# Patient Record
Sex: Male | Born: 1950
Health system: Southern US, Community
[De-identification: ages and names within clinical notes are randomized; demographics above are authoritative.]

## PROBLEM LIST (undated history)

## (undated) DIAGNOSIS — G43909 Migraine, unspecified, not intractable, without status migrainosus: Secondary | ICD-10-CM

## (undated) DIAGNOSIS — T7840XA Allergy, unspecified, initial encounter: Secondary | ICD-10-CM

## (undated) DIAGNOSIS — B019 Varicella without complication: Secondary | ICD-10-CM

## (undated) DIAGNOSIS — F1910 Other psychoactive substance abuse, uncomplicated: Secondary | ICD-10-CM

## (undated) DIAGNOSIS — M1990 Unspecified osteoarthritis, unspecified site: Secondary | ICD-10-CM

## (undated) HISTORY — DX: Unspecified osteoarthritis, unspecified site: M19.90

## (undated) HISTORY — DX: Migraine, unspecified, not intractable, without status migrainosus: G43.909

## (undated) HISTORY — DX: Varicella without complication: B01.9

## (undated) HISTORY — DX: Allergy, unspecified, initial encounter: T78.40XA

## (undated) HISTORY — DX: Other psychoactive substance abuse, uncomplicated: F19.10

---
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
## 2004-02-09 DIAGNOSIS — F1910 Other psychoactive substance abuse, uncomplicated: Secondary | ICD-10-CM

## 2004-02-09 HISTORY — DX: Other psychoactive substance abuse, uncomplicated: F19.10

## 2004-12-08 ENCOUNTER — Encounter: Admission: RE | Admit: 2004-12-08 | Discharge: 2004-12-08 | Payer: Self-pay | Admitting: Neurosurgery

## 2004-12-24 ENCOUNTER — Encounter: Admission: RE | Admit: 2004-12-24 | Discharge: 2004-12-24 | Payer: Self-pay | Admitting: Neurosurgery

## 2005-02-03 ENCOUNTER — Encounter: Admission: RE | Admit: 2005-02-03 | Discharge: 2005-02-03 | Payer: Self-pay | Admitting: Neurosurgery

## 2005-05-19 ENCOUNTER — Encounter: Payer: Self-pay | Admitting: Neurosurgery

## 2006-10-21 ENCOUNTER — Ambulatory Visit: Payer: Self-pay | Admitting: General Surgery

## 2009-02-08 HISTORY — PX: HERNIA REPAIR: SHX51

## 2010-02-08 HISTORY — PX: CATARACT EXTRACTION, BILATERAL: SHX1313

## 2010-02-08 HISTORY — PX: HERNIA REPAIR: SHX51

## 2010-02-20 ENCOUNTER — Ambulatory Visit: Payer: Self-pay | Admitting: Surgery

## 2010-02-27 ENCOUNTER — Ambulatory Visit: Payer: Self-pay | Admitting: Surgery

## 2010-02-28 ENCOUNTER — Encounter: Payer: Self-pay | Admitting: Neurosurgery

## 2010-03-02 LAB — PATHOLOGY REPORT

## 2010-04-01 ENCOUNTER — Ambulatory Visit: Payer: Self-pay | Admitting: Ophthalmology

## 2011-01-05 ENCOUNTER — Emergency Department: Payer: Self-pay | Admitting: Emergency Medicine

## 2011-01-13 ENCOUNTER — Ambulatory Visit: Payer: Self-pay | Admitting: Specialist

## 2011-02-09 HISTORY — PX: HIP SURGERY: SHX245

## 2011-03-15 ENCOUNTER — Ambulatory Visit: Payer: Self-pay | Admitting: General Practice

## 2011-09-15 ENCOUNTER — Ambulatory Visit: Payer: Self-pay | Admitting: Ophthalmology

## 2012-08-30 ENCOUNTER — Encounter: Payer: Self-pay | Admitting: Internal Medicine

## 2012-08-30 ENCOUNTER — Ambulatory Visit (INDEPENDENT_AMBULATORY_CARE_PROVIDER_SITE_OTHER): Payer: No Typology Code available for payment source | Admitting: Internal Medicine

## 2012-08-30 VITALS — BP 126/76 | HR 70 | Temp 98.1°F | Resp 14 | Ht 69.0 in | Wt 171.2 lb

## 2012-08-30 DIAGNOSIS — Z125 Encounter for screening for malignant neoplasm of prostate: Secondary | ICD-10-CM

## 2012-08-30 DIAGNOSIS — Z13220 Encounter for screening for lipoid disorders: Secondary | ICD-10-CM

## 2012-08-30 DIAGNOSIS — Z23 Encounter for immunization: Secondary | ICD-10-CM

## 2012-08-30 DIAGNOSIS — M129 Arthropathy, unspecified: Secondary | ICD-10-CM

## 2012-08-30 DIAGNOSIS — M1990 Unspecified osteoarthritis, unspecified site: Secondary | ICD-10-CM

## 2012-08-30 DIAGNOSIS — R079 Chest pain, unspecified: Secondary | ICD-10-CM

## 2012-08-30 DIAGNOSIS — R5383 Other fatigue: Secondary | ICD-10-CM

## 2012-08-30 DIAGNOSIS — R5381 Other malaise: Secondary | ICD-10-CM

## 2012-08-30 MED ORDER — DEXLANSOPRAZOLE 60 MG PO CPDR: 60.0000 mg | DELAYED_RELEASE_CAPSULE | Freq: Every day | ORAL | Status: DC
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
## 2012-08-30 NOTE — Patient Instructions (Addendum)
Come back for fasting bloodwork at your earliest convenience  Referral to Dr Markham Jordan for both EGD and colonoscopy  Trial of Dexilant for your stomach.  Take it once daily in the morning.  After you run out ,  Switch to prevacid or  omemprazole , both are available OTC    You received the TDaP vaccine today (tetanus diphtheria and pertussis )

## 2012-09-01 ENCOUNTER — Encounter: Payer: Self-pay | Admitting: Internal Medicine

## 2012-09-01 DIAGNOSIS — M1990 Unspecified osteoarthritis, unspecified site: Secondary | ICD-10-CM | POA: Insufficient documentation

## 2012-09-01 MED ORDER — TRAMADOL HCL 50 MG PO TABS: 50.0000 mg | ORAL_TABLET | Freq: Three times a day (TID) | ORAL | Status: DC | PRN

## 2012-09-01 NOTE — Assessment & Plan Note (Signed)
His recent episode is atypical for ischemic chest pain but more indicative of an esophageal spasm precipitated  by a bolus of macaroni and cheese. I'm recommending that he stop nonsteroidals and have given samples of DEXA Lantus use for the next 2 weeks. After 2 weeks he can switch to an over-the-counter medication. Given his history recurrent reflux and bright red blood per rectum use of nonsteroidals he has been advised to follow through with the GI referral to Dr. Mechele Collin.

## 2012-09-01 NOTE — Assessment & Plan Note (Addendum)
He is tolerating twice daily use of naproxen without recurrent GI bleeding. However his recent episode of esophageal pain warrants further evaluation with endoscopy. I'm recommending him to stop using nonsteroidals currently in use tramadol. I am referring him to Dr. Mechele Collin for evaluation with endoscopy and colonoscopy.

## 2012-09-07 ENCOUNTER — Other Ambulatory Visit (INDEPENDENT_AMBULATORY_CARE_PROVIDER_SITE_OTHER): Payer: No Typology Code available for payment source

## 2012-09-07 DIAGNOSIS — Z13220 Encounter for screening for lipoid disorders: Secondary | ICD-10-CM

## 2012-09-07 DIAGNOSIS — R5383 Other fatigue: Secondary | ICD-10-CM

## 2012-09-07 DIAGNOSIS — Z125 Encounter for screening for malignant neoplasm of prostate: Secondary | ICD-10-CM

## 2012-09-07 DIAGNOSIS — R5381 Other malaise: Secondary | ICD-10-CM

## 2012-09-07 LAB — COMPREHENSIVE METABOLIC PANEL
ALT: 13 U/L (ref 0–53)
Albumin: 4 g/dL (ref 3.5–5.2)
Alkaline Phosphatase: 51 U/L (ref 39–117)
BUN: 18 mg/dL (ref 6–23)
CO2: 28 mEq/L (ref 19–32)
Calcium: 9.3 mg/dL (ref 8.4–10.5)
Chloride: 105 mEq/L (ref 96–112)
GFR: 120.53 mL/min (ref >60.00)
Glucose, Bld: 89 mg/dL (ref 70–99)
Potassium: 4.2 mEq/L (ref 3.5–5.1)
Sodium: 140 mEq/L (ref 135–145)
Total Bilirubin: 0.6 mg/dL (ref 0.3–1.2)
Total Protein: 6.5 g/dL (ref 6.0–8.3)

## 2012-09-07 LAB — CBC WITH DIFFERENTIAL/PLATELET
Basophils Absolute: 0 10*3/uL (ref 0.0–0.1)
Basophils Relative: 0.7 % (ref 0.0–3.0)
Eosinophils Absolute: 0.4 10*3/uL (ref 0.0–0.7)
Eosinophils Relative: 5.8 % — ABNORMAL HIGH (ref 0.0–5.0)
HCT: 41.6 % (ref 39.0–52.0)
Hemoglobin: 13.9 g/dL (ref 13.0–17.0)
Lymphocytes Relative: 22.6 % (ref 12.0–46.0)
Lymphs Abs: 1.5 10*3/uL (ref 0.7–4.0)
MCHC: 33.3 g/dL (ref 30.0–36.0)
MCV: 94.7 fl (ref 78.0–100.0)
Monocytes Absolute: 0.6 10*3/uL (ref 0.1–1.0)
Monocytes Relative: 9.6 % (ref 3.0–12.0)
Neutro Abs: 3.9 10*3/uL (ref 1.4–7.7)
Neutrophils Relative %: 61.3 % (ref 43.0–77.0)
Platelets: 248 10*3/uL (ref 150.0–400.0)
RBC: 4.39 Mil/uL (ref 4.22–5.81)
WBC: 6.4 10*3/uL (ref 4.5–10.5)

## 2012-09-07 LAB — LIPID PANEL
Cholesterol: 187 mg/dL (ref 0–200)
HDL: 51.2 mg/dL (ref >39.00)
LDL Cholesterol: 115 mg/dL — ABNORMAL HIGH (ref 0–99)
Total CHOL/HDL Ratio: 4
Triglycerides: 106 mg/dL (ref 0.0–149.0)
VLDL: 21.2 mg/dL (ref 0.0–40.0)

## 2012-09-07 LAB — TSH: TSH: 1.62 u[IU]/mL (ref 0.35–5.50)

## 2012-09-07 LAB — PSA: PSA: 1.35 ng/mL (ref 0.10–4.00)

## 2012-09-08 ENCOUNTER — Encounter: Payer: Self-pay | Admitting: *Deleted

## 2012-09-11 ENCOUNTER — Telehealth: Payer: Self-pay | Admitting: Internal Medicine

## 2012-09-11 DIAGNOSIS — R911 Solitary pulmonary nodule: Secondary | ICD-10-CM | POA: Insufficient documentation

## 2012-09-11 DIAGNOSIS — N402 Nodular prostate without lower urinary tract symptoms: Secondary | ICD-10-CM

## 2012-09-11 DIAGNOSIS — Z125 Encounter for screening for malignant neoplasm of prostate: Secondary | ICD-10-CM | POA: Insufficient documentation

## 2012-09-11 NOTE — Telephone Encounter (Signed)
The patient has decided against this xray and he stated that the lung nodule was nothing to worry about

## 2012-09-11 NOTE — Telephone Encounter (Signed)
I received his records from Dr Marguerite Olea.  He has a lung nodule that was found on chest x ray 2013 that ha snot had follow up per their notes...  I would like him to have one this week at [facility] (here in [place])

## 2012-09-15 ENCOUNTER — Encounter: Payer: Self-pay | Admitting: Internal Medicine

## 2012-10-30 ENCOUNTER — Ambulatory Visit: Payer: Self-pay | Admitting: Gastroenterology

## 2012-10-30 LAB — HM COLONOSCOPY: HM Colonoscopy: 2

## 2012-10-31 LAB — PATHOLOGY REPORT

## 2012-11-07 ENCOUNTER — Other Ambulatory Visit: Payer: Self-pay | Admitting: Internal Medicine

## 2012-11-07 DIAGNOSIS — Z860100 Personal history of colon polyps, unspecified: Secondary | ICD-10-CM | POA: Insufficient documentation

## 2012-11-07 DIAGNOSIS — K219 Gastro-esophageal reflux disease without esophagitis: Secondary | ICD-10-CM

## 2012-11-23 ENCOUNTER — Other Ambulatory Visit: Payer: Self-pay | Admitting: Internal Medicine

## 2015-01-01 ENCOUNTER — Encounter: Payer: No Typology Code available for payment source | Admitting: Internal Medicine

## 2015-03-13 ENCOUNTER — Encounter: Payer: Self-pay | Admitting: Internal Medicine

## 2015-03-13 ENCOUNTER — Encounter: Payer: No Typology Code available for payment source | Admitting: Internal Medicine

## 2015-03-13 ENCOUNTER — Ambulatory Visit (INDEPENDENT_AMBULATORY_CARE_PROVIDER_SITE_OTHER): Payer: Managed Care, Other (non HMO) | Admitting: Internal Medicine

## 2015-03-13 VITALS — BP 144/80 | HR 55 | Temp 97.8°F | Resp 12 | Ht 69.0 in | Wt 172.2 lb

## 2015-03-13 DIAGNOSIS — Z125 Encounter for screening for malignant neoplasm of prostate: Secondary | ICD-10-CM

## 2015-03-13 DIAGNOSIS — Z87891 Personal history of nicotine dependence: Secondary | ICD-10-CM

## 2015-03-13 DIAGNOSIS — R911 Solitary pulmonary nodule: Secondary | ICD-10-CM

## 2015-03-13 DIAGNOSIS — K2100 Gastro-esophageal reflux disease with esophagitis, without bleeding: Secondary | ICD-10-CM

## 2015-03-13 DIAGNOSIS — M1990 Unspecified osteoarthritis, unspecified site: Secondary | ICD-10-CM | POA: Diagnosis not present

## 2015-03-13 DIAGNOSIS — Z0000 Encounter for general adult medical examination without abnormal findings: Secondary | ICD-10-CM

## 2015-03-13 DIAGNOSIS — R5383 Other fatigue: Secondary | ICD-10-CM

## 2015-03-13 DIAGNOSIS — R0609 Other forms of dyspnea: Secondary | ICD-10-CM

## 2015-03-13 DIAGNOSIS — R002 Palpitations: Secondary | ICD-10-CM

## 2015-03-13 DIAGNOSIS — N401 Enlarged prostate with lower urinary tract symptoms: Secondary | ICD-10-CM

## 2015-03-13 DIAGNOSIS — R351 Nocturia: Secondary | ICD-10-CM | POA: Diagnosis not present

## 2015-03-13 DIAGNOSIS — E785 Hyperlipidemia, unspecified: Secondary | ICD-10-CM

## 2015-03-13 DIAGNOSIS — Z1211 Encounter for screening for malignant neoplasm of colon: Secondary | ICD-10-CM

## 2015-03-13 DIAGNOSIS — Z1159 Encounter for screening for other viral diseases: Secondary | ICD-10-CM

## 2015-03-13 DIAGNOSIS — E559 Vitamin D deficiency, unspecified: Secondary | ICD-10-CM

## 2015-03-13 LAB — COMPREHENSIVE METABOLIC PANEL
ALT: 10 U/L (ref 0–53)
AST: 14 U/L (ref 0–37)
Albumin: 4.4 g/dL (ref 3.5–5.2)
Alkaline Phosphatase: 69 U/L (ref 39–117)
BUN: 18 mg/dL (ref 6–23)
CO2: 28 mEq/L (ref 19–32)
Calcium: 9.5 mg/dL (ref 8.4–10.5)
Chloride: 103 mEq/L (ref 96–112)
Creatinine, Ser: 0.79 mg/dL (ref 0.40–1.50)
GFR: 104.02 mL/min (ref >60.00)
Glucose, Bld: 93 mg/dL (ref 70–99)
Potassium: 4.4 mEq/L (ref 3.5–5.1)
Sodium: 141 mEq/L (ref 135–145)
Total Bilirubin: 0.4 mg/dL (ref 0.2–1.2)
Total Protein: 7.2 g/dL (ref 6.0–8.3)

## 2015-03-13 LAB — IRON AND TIBC
%SAT: 23 % (ref 15–60)
Iron: 66 ug/dL (ref 50–180)
TIBC: 283 ug/dL (ref 250–425)
UIBC: 217 ug/dL (ref 125–400)

## 2015-03-13 LAB — PSA: PSA: 3.03 ng/mL (ref 0.10–4.00)

## 2015-03-13 LAB — CBC WITH DIFFERENTIAL/PLATELET
Basophils Absolute: 0.1 10*3/uL (ref 0.0–0.1)
Basophils Relative: 0.9 % (ref 0.0–3.0)
Eosinophils Absolute: 0.4 10*3/uL (ref 0.0–0.7)
Eosinophils Relative: 5.3 % — ABNORMAL HIGH (ref 0.0–5.0)
HCT: 42.3 % (ref 39.0–52.0)
Hemoglobin: 13.9 g/dL (ref 13.0–17.0)
Lymphocytes Relative: 24.4 % (ref 12.0–46.0)
Lymphs Abs: 1.6 10*3/uL (ref 0.7–4.0)
MCHC: 32.9 g/dL (ref 30.0–36.0)
MCV: 91.1 fl (ref 78.0–100.0)
Monocytes Absolute: 0.7 10*3/uL (ref 0.1–1.0)
Monocytes Relative: 10.1 % (ref 3.0–12.0)
Neutro Abs: 4 10*3/uL (ref 1.4–7.7)
Neutrophils Relative %: 59.3 % (ref 43.0–77.0)
Platelets: 349 10*3/uL (ref 150.0–400.0)
RBC: 4.64 Mil/uL (ref 4.22–5.81)
RDW: 13.7 % (ref 11.5–15.5)
WBC: 6.7 10*3/uL (ref 4.0–10.5)

## 2015-03-13 LAB — LIPID PANEL
Cholesterol: 181 mg/dL (ref 0–200)
HDL: 55.6 mg/dL (ref >39.00)
LDL Cholesterol: 113 mg/dL — ABNORMAL HIGH (ref 0–99)
NonHDL: 125.68
Total CHOL/HDL Ratio: 3
Triglycerides: 63 mg/dL (ref 0.0–149.0)
VLDL: 12.6 mg/dL (ref 0.0–40.0)

## 2015-03-13 LAB — POCT URINALYSIS DIPSTICK
Glucose, UA: NEGATIVE
Leukocytes, UA: NEGATIVE
Nitrite, UA: NEGATIVE
Protein, UA: NEGATIVE
Spec Grav, UA: >=1.03
Urobilinogen, UA: 0.2
pH, UA: 6

## 2015-03-13 LAB — VITAMIN D 25 HYDROXY (VIT D DEFICIENCY, FRACTURES): VITD: 28.22 ng/mL — ABNORMAL LOW (ref 30.00–100.00)

## 2015-03-13 LAB — TSH: TSH: 1.63 u[IU]/mL (ref 0.35–4.50)

## 2015-03-13 LAB — MAGNESIUM: Magnesium: 2.1 mg/dL (ref 1.5–2.5)

## 2015-03-13 MED ORDER — TRAMADOL HCL 50 MG PO TABS: 50.0000 mg | ORAL_TABLET | Freq: Three times a day (TID) | ORAL | Status: DC | PRN

## 2015-03-13 MED ORDER — TADALAFIL 20 MG PO TABS: 10.0000 mg | ORAL_TABLET | Freq: Every day | ORAL | Status: DC

## 2015-03-13 NOTE — Patient Instructions (Signed)
If you change your mind about the vaccines, please return!  You need a Chest x ray to follow up on the pulmonary nodule seen in 2013  You need a follow up colonscopy this year because of the precancerous colon polyps that were removed in 2014  Make Gas X and Beano part of your daily routine for the Gas issues  Health Maintenance, Male A healthy lifestyle and preventative care can promote health and wellness.  Maintain regular health, dental, and eye exams.  Eat a healthy diet. Foods like vegetables, fruits, whole grains, low-fat dairy products, and lean protein foods contain the nutrients you need and are low in calories. Decrease your intake of foods high in solid fats, added sugars, and salt. Get information about a proper diet from your health care provider, if necessary.  Regular physical exercise is one of the most important things you can do for your health. Most adults should get at least 150 minutes of moderate-intensity exercise (any activity that increases your heart rate and causes you to sweat) each week. In addition, most adults need muscle-strengthening exercises on 2 or more days a week.   Maintain a healthy weight. The body mass index (BMI) is a screening tool to identify possible weight problems. It provides an estimate of body fat based on height and weight. Your health care provider can find your BMI and can help you achieve or maintain a healthy weight. For males 20 years and older:  A BMI below 18.5 is considered underweight.  A BMI of 18.5 to 24.9 is normal.  A BMI of 25 to 29.9 is considered overweight.  A BMI of 30 and above is considered obese.  Maintain normal blood lipids and cholesterol by exercising and minimizing your intake of saturated fat. Eat a balanced diet with plenty of fruits and vegetables. Blood tests for lipids and cholesterol should begin at age 63 and be repeated every 5 years. If your lipid or cholesterol levels are high, you are over age 59, or  you are at high risk for heart disease, you may need your cholesterol levels checked more frequently.Ongoing high lipid and cholesterol levels should be treated with medicines if diet and exercise are not working.  If you smoke, find out from your health care provider how to quit. If you do not use tobacco, do not start.  Lung cancer screening is recommended for adults aged 48-80 years who are at high risk for developing lung cancer because of a history of smoking. A yearly low-dose CT scan of the lungs is recommended for people who have at least a 30-pack-year history of smoking and are current smokers or have quit within the past 15 years. A pack year of smoking is smoking an average of 1 pack of cigarettes a day for 1 year (for example, a 30-pack-year history of smoking could mean smoking 1 pack a day for 30 years or 2 packs a day for 15 years). Yearly screening should continue until the smoker has stopped smoking for at least 15 years. Yearly screening should be stopped for people who develop a health problem that would prevent them from having lung cancer treatment.  If you choose to drink alcohol, do not have more than 2 drinks per day. One drink is considered to be 12 oz (360 mL) of beer, 5 oz (150 mL) of wine, or 1.5 oz (45 mL) of liquor.  Avoid the use of street drugs. Do not share needles with anyone. Ask for help if  you need support or instructions about stopping the use of drugs.  High blood pressure causes heart disease and increases the risk of stroke. High blood pressure is more likely to develop in:  People who have blood pressure in the end of the normal range (100-139/85-89 mm Hg).  People who are overweight or obese.  People who are African American.  If you are 60-102 years of age, have your blood pressure checked every 3-5 years. If you are 56 years of age or older, have your blood pressure checked every year. You should have your blood pressure measured twice--once when you  are at a hospital or clinic, and once when you are not at a hospital or clinic. Record the average of the two measurements. To check your blood pressure when you are not at a hospital or clinic, you can use:  An automated blood pressure machine at a pharmacy.  A home blood pressure monitor.  If you are 17-74 years old, ask your health care provider if you should take aspirin to prevent heart disease.  Diabetes screening involves taking a blood sample to check your fasting blood sugar level. This should be done once every 3 years after age 73 if you are at a normal weight and without risk factors for diabetes. Testing should be considered at a younger age or be carried out more frequently if you are overweight and have at least 1 risk factor for diabetes.  Colorectal cancer can be detected and often prevented. Most routine colorectal cancer screening begins at the age of 92 and continues through age 70. However, your health care provider may recommend screening at an earlier age if you have risk factors for colon cancer. On a yearly basis, your health care provider may provide home test kits to check for hidden blood in the stool. A small camera at the end of a tube may be used to directly examine the colon (sigmoidoscopy or colonoscopy) to detect the earliest forms of colorectal cancer. Talk to your health care provider about this at age 66 when routine screening begins. A direct exam of the colon should be repeated every 5-10 years through age 55, unless early forms of precancerous polyps or small growths are found.  People who are at an increased risk for hepatitis B should be screened for this virus. You are considered at high risk for hepatitis B if:  You were born in a country where hepatitis B occurs often. Talk with your health care provider about which countries are considered high risk.  Your parents were born in a high-risk country and you have not received a shot to protect against  hepatitis B (hepatitis B vaccine).  You have HIV or AIDS.  You use needles to inject street drugs.  You live with, or have sex with, someone who has hepatitis B.  You are a man who has sex with other men (MSM).  You get hemodialysis treatment.  You take certain medicines for conditions like cancer, organ transplantation, and autoimmune conditions.  Hepatitis C blood testing is recommended for all people born from [birth year] through 1967 and any individual with known risk factors for hepatitis C.  Healthy men should no longer receive prostate-specific antigen (PSA) blood tests as part of routine cancer screening. Talk to your health care provider about prostate cancer screening.  Testicular cancer screening is not recommended for adolescents or adult males who have no symptoms. Screening includes self-exam, a health care provider exam, and other screening tests. Consult  with your health care provider about any symptoms you have or any concerns you have about testicular cancer.  Practice safe sex. Use condoms and avoid high-risk sexual practices to reduce the spread of sexually transmitted infections (STIs).  You should be screened for STIs, including gonorrhea and chlamydia if:  You are sexually active and are younger than 24 years.  You are older than 24 years, and your health care provider tells you that you are at risk for this type of infection.  Your sexual activity has changed since you were last screened, and you are at an increased risk for chlamydia or gonorrhea. Ask your health care provider if you are at risk.  If you are at risk of being infected with HIV, it is recommended that you take a prescription medicine daily to prevent HIV infection. This is called pre-exposure prophylaxis (PrEP). You are considered at risk if:  You are a man who has sex with other men (MSM).  You are a heterosexual man who is sexually active with multiple partners.  You take drugs by  injection.  You are sexually active with a partner who has HIV.  Talk with your health care provider about whether you are at high risk of being infected with HIV. If you choose to begin PrEP, you should first be tested for HIV. You should then be tested every 3 months for as long as you are taking PrEP.  Use sunscreen. Apply sunscreen liberally and repeatedly throughout the day. You should seek shade when your shadow is shorter than you. Protect yourself by wearing long sleeves, pants, a wide-brimmed hat, and sunglasses year round whenever you are outdoors.  Tell your health care provider of new moles or changes in moles, especially if there is a change in shape or color. Also, tell your health care provider if a mole is larger than the size of a pencil eraser.  A one-time screening for abdominal aortic aneurysm (AAA) and surgical repair of large AAAs by ultrasound is recommended for men aged 41-75 years who are current or former smokers.  Stay current with your vaccines (immunizations).   This information is not intended to replace advice given to you by your health care provider. Make sure you discuss any questions you have with your health care provider.   Document Released: 07/24/2007 Document Revised: 02/15/2014 Document Reviewed: 06/22/2010 Elsevier Interactive Patient Education [employer].

## 2015-03-13 NOTE — Progress Notes (Signed)
Pre-visit discussion using our clinic review tool. No additional management support is needed unless otherwise documented below in the visit note.  

## 2015-03-14 LAB — HEPATITIS C ANTIBODY: HCV Ab: NEGATIVE

## 2015-03-15 ENCOUNTER — Encounter: Payer: Self-pay | Admitting: Internal Medicine

## 2015-03-15 DIAGNOSIS — Z0000 Encounter for general adult medical examination without abnormal findings: Secondary | ICD-10-CM | POA: Insufficient documentation

## 2015-03-15 DIAGNOSIS — R351 Nocturia: Secondary | ICD-10-CM

## 2015-03-15 DIAGNOSIS — R5383 Other fatigue: Secondary | ICD-10-CM | POA: Insufficient documentation

## 2015-03-15 DIAGNOSIS — N401 Enlarged prostate with lower urinary tract symptoms: Secondary | ICD-10-CM | POA: Insufficient documentation

## 2015-03-15 DIAGNOSIS — Z0001 Encounter for general adult medical examination with abnormal findings: Secondary | ICD-10-CM | POA: Insufficient documentation

## 2015-03-15 MED ORDER — TAMSULOSIN HCL 0.4 MG PO CAPS: 0.4000 mg | ORAL_CAPSULE | Freq: Every day | ORAL | Status: DC

## 2015-03-15 NOTE — Progress Notes (Signed)
Patient ID: Timothy Carrillo, male    DOB: [birth year]  Age: 65 y.o. MRN: [id number]  The patient is here for annual wellness examination and management of other chronic and acute problems. He was last seen in  July 2014.    The risk factors are reflected in the social history.  The roster of all physicians providing medical care to patient - is listed in the Snapshot section of the chart.  Activities of daily living:  The patient is 100% independent in all ADLs: dressing, toileting, feeding as well as independent mobility  Home safety : The patient has smoke detectors in the home. They wear seatbelts.  There are no firearms at home. There is no violence in the home.   There is no risks for hepatitis, STDs or HIV. There is no   history of blood transfusion. They have no travel history to infectious disease endemic areas of the world.  The patient has seen their dentist in the last six month. They have seen their eye doctor in the last year. They admit to slight hearing difficulty with regard to whispered voices and some television programs.  They have deferred audiologic testing in the last year.  They do not  have excessive sun exposure. Discussed the need for sun protection: hats, long sleeves and use of sunscreen if there is significant sun exposure.   Diet: the importance of a healthy diet is discussed. They do have a healthy diet.  The benefits of regular aerobic exercise were discussed. She walks 4 times per week ,  20 minutes.   Depression screen: there are no signs or vegative symptoms of depression- irritability, change in appetite, anhedonia, sadness/tearfullness.  Cognitive assessment: the patient manages all their financial and personal affairs and is actively engaged. They could relate day,date,year and events; recalled 2/3 objects at 3 minutes; performed clock-face test normally.  The following portions of the patient's history were reviewed and updated as appropriate:  allergies, current medications, past family history, past medical history,  past surgical history, past social history  and problem list.  Visual acuity was not assessed per patient preference since she has regular follow up with her ophthalmologist. Hearing and body mass index were assessed and reviewed.   During the course of the visit the patient was educated and counseled about appropriate screening and preventive services including : fall prevention , diabetes screening, nutrition counseling, colorectal cancer screening, and recommended immunizations.    CC: The primary encounter diagnosis was Visit for preventive health examination. Diagnoses of Arthritis, Other fatigue, Solitary pulmonary nodule, Palpitations, Hyperlipidemia, Need for hepatitis C screening test, Vitamin D deficiency, Screening for prostate cancer, Nocturia, Lung nodule seen on imaging study, Reflux esophagitis, Prostate cancer screening, and BPH associated with nocturia were also pertinent to this visit.  cc of fatigue and dizziness He reports frequent exertional fatigue without chest pain or dyspnea.   He has nocturia  Which is interrupting his sleep.  Urinary stream is weak.  AUA score 25.   He has had two episodes of vertigo lasting a day or two,  Which occurred in Dec 2016. Other episodes of feeling light headed. Not sure if he was having sinus congestion   Joint pain involving the right hip and back.  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  He has been using heating pads on muscle spasm and helps.  The tramadol helped a lot,    Did not tolerate trial of MR   Taking  omeprazole 2 times daily.  For esophagitis seen on 2014 EGD.   2 tubular adenomatous polyps and diverticulosis on  colonoscopy in 2014.  Repeat scope was recommended in 2017.  Pulmonary nodules seen on imaging in 2014 , workup deferred by patent at last visit.  History of tobacco  abuse  Wt is stable  BP elevated today.  Does not  check it at home.      History Timothy Carrillo has a past medical history of Chicken pox; Allergy; Migraines; Substance abuse (2006); and Arthritis.   He has past surgical history that includes Appendectomy (1965); Eye surgery (Bilateral, 2012); Hip surgery (Right, 2013); Hernia repair (Right, 2011); and Hernia repair (Right, 2012).   His family history includes Cancer in his father; Stroke in his mother.He reports that he quit smoking about 10 years ago. His smoking use included Cigarettes. He has never used smokeless tobacco. He reports that he drinks about 5.0 oz of alcohol per week. He reports that he does not use illicit drugs.  Outpatient Prescriptions Prior to Visit  Medication Sig Dispense Refill  . dexlansoprazole (DEXILANT) 60 MG capsule Take 1 capsule (60 mg total) by mouth daily. 15 capsule 0  . naproxen sodium (ANAPROX) 220 MG tablet Take 220 mg by mouth 2 (two) times daily with a meal.    . traMADol (ULTRAM) 50 MG tablet Take 1 tablet (50 mg total) by mouth every 8 (eight) hours as needed for pain. (Patient not taking: Reported on 03/13/2015) 90 tablet 3   No facility-administered medications prior to visit.    Review of Systems   Patient denies headache, fevers, malaise, unintentional weight loss, skin rash, eye pain, sinus congestion and sinus pain, sore throat, dysphagia,  hemoptysis , cough, dyspnea, wheezing, chest pain, palpitations, orthopnea, edema, abdominal pain, nausea, melena, diarrhea, constipation, flank pain, dysuria, hematuria, urinary  Frequency, nocturia, numbness, tingling, seizures,  Focal weakness, Loss of consciousness,  Tremor, insomnia, depression, anxiety, and suicidal ideation.      Objective:  BP 144/80 mmHg  Pulse 55  Temp(Src) 97.8 F (36.6 C) (Oral)  Resp 12  Ht 5\' 9"  (1.753 m)  Wt 172 lb 4 oz (78.132 kg)  BMI 25.43 kg/m2  SpO2 97%  Physical Exam   General appearance: alert, cooperative and appears stated age Ears: normal TM's and external ear  canals both ears Throat: lips, mucosa, and tongue normal; teeth and gums normal Neck: no adenopathy, no carotid bruit, supple, symmetrical, trachea midline and thyroid not enlarged, symmetric, no tenderness/mass/nodules Back: symmetric, no curvature. ROM normal. No CVA tenderness. Lungs: clear to auscultation bilaterally Heart: regular rate and rhythm, S1, S2 normal, no murmur, click, rub or gallop Abdomen: soft, non-tender; bowel sounds normal; no masses,  no organomegaly Pulses: 2+ and symmetric Skin: Skin color, texture, turgor normal. No rashes or lesions Lymph nodes: Cervical, supraclavicular, and axillary nodes normal.    Assessment & Plan:   Problem List Items Addressed This Visit    Reflux esophagitis    On 2014 EGD for chest pain.  Continue omeprazole bid.       Lung nodule seen on imaging study    Repeat chest x ray strongly advised and ordered.       Prostate cancer screening    PSA is normal.  BPD suspected based on report of nocturia.   Lab Results  Component Value Date   PSA 3.03 03/13/2015   PSA 1.35 09/07/2012         Fatigue    Normal  thyroid and CBC, may be due to occult COPD vs CAD   Lab Results  Component Value Date   TSH 1.63 03/13/2015   Lab Results  Component Value Date   WBC 6.7 03/13/2015   HGB 13.9 03/13/2015   HCT 42.3 03/13/2015   MCV 91.1 03/13/2015   PLT 349.0 03/13/2015         Relevant Orders   Comprehensive metabolic panel (Completed)   TSH (Completed)   CBC with Differential/Platelet (Completed)   Iron and TIBC (Completed)   Visit for preventive health examination - Primary    Annual comprehensive preventive exam was done as well as an evaluation and management of chronic conditions .  During the course of the visit the patient was educated and counseled about appropriate screening and preventive services including :  diabetes screening, lipid analysis with projected  10 year  risk for CAD  , nutrition counseling, colorectal  cancer screening, and recommended immunizations.  Printed recommendations for health maintenance screenings was given.   Lab Results  Component Value Date   CHOL 181 03/13/2015   HDL 55.60 03/13/2015   LDLCALC 113* 03/13/2015   TRIG 63.0 03/13/2015   CHOLHDL 3 03/13/2015         BPH associated with nocturia    UA and PSA are normal.  Adding flomax      Relevant Medications   tamsulosin (FLOMAX) 0.4 MG CAPS capsule   Arthritis   Relevant Medications   traMADol (ULTRAM) 50 MG tablet    Other Visit Diagnoses    Solitary pulmonary nodule        Relevant Orders    DG Chest 2 View    Palpitations        Relevant Orders    TSH (Completed)    Magnesium (Completed)    Hyperlipidemia        Relevant Medications    tadalafil (CIALIS) 20 MG tablet    Other Relevant Orders    Lipid panel (Completed)    Need for hepatitis C screening test        Relevant Orders    Hepatitis C antibody (Completed)    Vitamin D deficiency        Relevant Orders    VITAMIN D 25 Hydroxy (Vit-D Deficiency, Fractures) (Completed)    Screening for prostate cancer        Relevant Orders    PSA (Completed)    Nocturia        Relevant Orders    POCT urinalysis dipstick (Completed)       I am having Mr. Botha start on tadalafil and tamsulosin. I am also having him maintain his naproxen sodium, dexlansoprazole, and traMADol.  Meds ordered this encounter  Medications  . DISCONTD: traMADol (ULTRAM) 50 MG tablet    Sig: Take 1 tablet (50 mg total) by mouth every 8 (eight) hours as needed.    Dispense:  30 tablet    Refill:  0  . DISCONTD: traMADol (ULTRAM) 50 MG tablet    Sig: Take 1 tablet (50 mg total) by mouth every 8 (eight) hours as needed.    Dispense:  90 tablet    Refill:  3  . tadalafil (CIALIS) 20 MG tablet    Sig: Take 0.5 tablets (10 mg total) by mouth daily. For  BPH    AUA score : 25    Dispense:  30 tablet    Refill:  11  . traMADol (ULTRAM) 50 MG tablet  Sig: Take 1 tablet  (50 mg total) by mouth every 8 (eight) hours as needed.    Dispense:  90 tablet    Refill:  3  . tamsulosin (FLOMAX) 0.4 MG CAPS capsule    Sig: Take 1 capsule (0.4 mg total) by mouth daily.    Dispense:  30 capsule    Refill:  3    Medications Discontinued During This Encounter  Medication Reason  . traMADol (ULTRAM) 50 MG tablet Reorder  . traMADol (ULTRAM) 50 MG tablet   . traMADol (ULTRAM) 50 MG tablet Reorder    Follow-up: No Follow-up on file.   Crecencio Mc, MD

## 2015-03-15 NOTE — Assessment & Plan Note (Signed)
On 2014 EGD for chest pain.  Continue omeprazole bid.

## 2015-03-15 NOTE — Assessment & Plan Note (Addendum)
PSA is normal.  BPD suspected based on report of nocturia.   Lab Results  Component Value Date   PSA 3.03 03/13/2015   PSA 1.35 09/07/2012

## 2015-03-15 NOTE — Assessment & Plan Note (Addendum)
UA and PSA are normal.  Adding flomax

## 2015-03-15 NOTE — Assessment & Plan Note (Signed)
Repeat chest x ray strongly advised and ordered.

## 2015-03-15 NOTE — Assessment & Plan Note (Signed)
Annual comprehensive preventive exam was done as well as an evaluation and management of chronic conditions .  During the course of the visit the patient was educated and counseled about appropriate screening and preventive services including :  diabetes screening, lipid analysis with projected  10 year  risk for CAD  , nutrition counseling, colorectal cancer screening, and recommended immunizations.  Printed recommendations for health maintenance screenings was given.   Lab Results  Component Value Date   CHOL 181 03/13/2015   HDL 55.60 03/13/2015   LDLCALC 113* 03/13/2015   TRIG 63.0 03/13/2015   CHOLHDL 3 03/13/2015

## 2015-03-15 NOTE — Assessment & Plan Note (Signed)
Normal thyroid and CBC, may be due to occult COPD vs CAD   Lab Results  Component Value Date   TSH 1.63 03/13/2015   Lab Results  Component Value Date   WBC 6.7 03/13/2015   HGB 13.9 03/13/2015   HCT 42.3 03/13/2015   MCV 91.1 03/13/2015   PLT 349.0 03/13/2015

## 2015-03-18 NOTE — Addendum Note (Signed)
Addended by: Crecencio Mc on: 03/18/2015 12:40 PM   Modules accepted: Orders, SmartSet

## 2015-03-18 NOTE — Addendum Note (Signed)
Addended by: Crecencio Mc on: 03/18/2015 12:42 PM   Modules accepted: Orders, SmartSet

## 2015-03-19 ENCOUNTER — Other Ambulatory Visit: Payer: Self-pay | Admitting: Urology

## 2015-03-25 ENCOUNTER — Ambulatory Visit (HOSPITAL_COMMUNITY): Payer: Managed Care, Other (non HMO)

## 2015-03-25 ENCOUNTER — Ambulatory Visit
Admission: RE | Admit: 2015-03-25 | Discharge: 2015-03-25 | Disposition: A | Discharge status: Home / Self Care | Payer: Managed Care, Other (non HMO) | Source: Ambulatory Visit | Attending: Internal Medicine | Admitting: Internal Medicine

## 2015-03-25 DIAGNOSIS — R911 Solitary pulmonary nodule: Secondary | ICD-10-CM

## 2015-03-25 DIAGNOSIS — Z87891 Personal history of nicotine dependence: Secondary | ICD-10-CM | POA: Diagnosis not present

## 2015-03-25 DIAGNOSIS — R5383 Other fatigue: Secondary | ICD-10-CM | POA: Diagnosis not present

## 2015-03-25 DIAGNOSIS — R0609 Other forms of dyspnea: Secondary | ICD-10-CM

## 2015-04-10 ENCOUNTER — Encounter (INDEPENDENT_AMBULATORY_CARE_PROVIDER_SITE_OTHER): Payer: Self-pay

## 2015-04-10 ENCOUNTER — Ambulatory Visit (INDEPENDENT_AMBULATORY_CARE_PROVIDER_SITE_OTHER): Payer: Managed Care, Other (non HMO) | Admitting: Cardiovascular Disease

## 2015-04-10 ENCOUNTER — Encounter: Payer: Self-pay | Admitting: Cardiovascular Disease

## 2015-04-10 VITALS — BP 124/70 | HR 59 | Ht 69.0 in | Wt 174.2 lb

## 2015-04-10 DIAGNOSIS — I499 Cardiac arrhythmia, unspecified: Secondary | ICD-10-CM | POA: Diagnosis not present

## 2015-04-10 DIAGNOSIS — R002 Palpitations: Secondary | ICD-10-CM

## 2015-04-10 DIAGNOSIS — R0602 Shortness of breath: Secondary | ICD-10-CM

## 2015-04-10 NOTE — Progress Notes (Signed)
Cardiology Office Note   Date:  04/10/2015   ID:  Timothy Carrillo, DOB 02/29/1950, MRN [id number]  PCP:  Crecencio Mc, MD  Cardiologist:   Kathlyn Sacramento, MD   Chief Complaint  Patient presents with  . other    C/o irregular heart beat and fatigue. Meds reviewed verbally with pt.      History of Present Illness: Timothy Carrillo is a 65 y.o. male who was referred by Dr. Derrel Nip for evaluation of exertional dyspnea and fatigue. He has no previous cardiac history and overall has been relatively healthy throughout his life. He is a previous smoker and quit smoking 9 years ago. He has no history of diabetes, hypertension or hyperlipidemia. He has known history of right hip arthritis with previous surgery. He reports previous chest pain attributed to GERD which has been successfully treated. Over the last few years, he has experienced gradual decline in his functional capacity with exertional fatigue and mild shortness of breath. No chest pain, orthopnea or PND. He is noted to have a PVC on his EKG but he does not have significant palpitations, syncope or presyncope. He has no family history of coronary artery disease.    Past Medical History  Diagnosis Date  . Chicken pox   . Allergy   . Migraines   . Substance abuse 2006    40 pk year tobacco   . Arthritis     Past Surgical History  Procedure Laterality Date  . Appendectomy  1965  . Eye surgery Bilateral 2012    2013  . Hip surgery Right 2013  . Hernia repair Right 2011  . Hernia repair Right 2012    redo of mesh , [place]     Current Outpatient Prescriptions  Medication Sig Dispense Refill  . dexlansoprazole (DEXILANT) 60 MG capsule Take 60 mg by mouth every other day.    . naproxen sodium (ANAPROX) 220 MG tablet Take 220 mg by mouth every other day.     . tadalafil (CIALIS) 20 MG tablet Take 0.5 tablets (10 mg total) by mouth daily. For  BPH    AUA score : 25 30 tablet 11  . traMADol (ULTRAM) 50 MG tablet Take  1 tablet (50 mg total) by mouth every 8 (eight) hours as needed. 90 tablet 3   No current facility-administered medications for this visit.    Allergies:   Review of patient's allergies indicates no known allergies.    Social History:  The patient  reports that he quit smoking about 10 years ago. His smoking use included Cigarettes. He has never used smokeless tobacco. He reports that he drinks about 5.0 oz of alcohol per week. He reports that he does not use illicit drugs.   Family History:  The patient's family history includes Cancer in his father; Stroke in his mother.    ROS:  Please see the history of present illness.   Otherwise, review of systems are positive for none.   All other systems are reviewed and negative.    PHYSICAL EXAM: VS:  BP 124/70 mmHg  Pulse 59  Ht 5\' 9"  (1.753 m)  Wt 174 lb 4 oz (79.039 kg)  BMI 25.72 kg/m2 , BMI Body mass index is 25.72 kg/(m^2). GEN: Well nourished, well developed, in no acute distress HEENT: normal Neck: no JVD, carotid bruits, or masses Cardiac: RRR; no murmurs, rubs, or gallops,no edema  Respiratory:  clear to auscultation bilaterally, normal work of breathing GI: soft, nontender, nondistended, +  BS MS: no deformity or atrophy Skin: warm and dry, no rash Neuro:  Strength and sensation are intact Psych: euthymic mood, full affect   EKG:  EKG is ordered today. The ekg ordered today demonstrates  sinus bradycardia with PVCs with no significant ST or T wave changes.   Recent Labs: 03/13/2015: ALT 10; BUN 18; Creatinine, Ser 0.79; Hemoglobin 13.9; Magnesium 2.1; Platelets 349.0; Potassium 4.4; Sodium 141; TSH 1.63    Lipid Panel    Component Value Date/Time   CHOL 181 03/13/2015 0937   TRIG 63.0 03/13/2015 0937   HDL 55.60 03/13/2015 0937   CHOLHDL 3 03/13/2015 0937   VLDL 12.6 03/13/2015 0937   LDLCALC 113* 03/13/2015 0937      Wt Readings from Last 3 Encounters:  04/10/15 174 lb 4 oz (79.039 kg)  03/13/15 172 lb 4 oz  (78.132 kg)  08/30/12 171 lb 4 oz (77.678 kg)       ASSESSMENT AND PLAN:  1.  Exertional dyspnea and fatigue: This could be due to physical deconditioning as he has not been exercising in a regular basis but overall he is active. Angina equivalent cannot be completely excluded and thus I agree with a stress test to evaluate that. Given the presence of PVCs on his EKG, a treadmill stress test is abnormal might not be interpretable. Thus, I requested a nuclear stress test. If cardiac workup is negative, pulmonary evaluation would be the next step especially that he is a previous smoker.  2. Asymptomatic PVCs: If ejection fraction is normal and no signs of ischemia, then no further evaluation of this as needed.       Disposition:   FU with me  as needed if cardiac workup is abnormal.   Signed, Kathlyn Sacramento, MD  04/10/2015 9:21 AM    [place]

## 2015-04-10 NOTE — Patient Instructions (Addendum)
Medication Instructions:  Your physician recommends that you continue on your current medications as directed. Please refer to the Current Medication list given to you today.   Labwork: none  Testing/Procedures: Your physician has requested that you have a lexiscan myoview. For further information please visit [email or web address]. Please follow instruction sheet, as given.  [place]  Your caregiver has ordered a Stress Test with nuclear imaging. The purpose of this test is to evaluate the blood supply to your heart muscle. This procedure is referred to as a "Non-Invasive Stress Test." This is because other than having an IV started in your vein, nothing is inserted or "invades" your body. Cardiac stress tests are done to find areas of poor blood flow to the heart by determining the extent of coronary artery disease (CAD). Some patients exercise on a treadmill, which naturally increases the blood flow to your heart, while others who are  unable to walk on a treadmill due to physical limitations have a pharmacologic/chemical stress agent called Lexiscan . This medicine will mimic walking on a treadmill by temporarily increasing your coronary blood flow.   Please note: these test may take anywhere between 2-4 hours to complete  PLEASE REPORT TO [facility] AT THE FIRST DESK WILL DIRECT YOU WHERE TO GO  Date of Procedure:_______March 7_______________  Arrival Time for Procedure:_____7:45am____________________  Instructions regarding medication:     PLEASE NOTIFY THE OFFICE AT LEAST 24 HOURS IN ADVANCE IF YOU ARE UNABLE TO KEEP YOUR APPOINTMENT.  [phone] AND  PLEASE NOTIFY NUCLEAR MEDICINE AT [facility] AT LEAST 24 HOURS IN ADVANCE IF YOU ARE UNABLE TO KEEP YOUR APPOINTMENT. [phone]  How to prepare for your Myoview test:   Do not eat or drink after midnight  No caffeine for 24 hours prior to test  No smoking 24 hours prior to test.  Your  medication may be taken with water.  If your doctor stopped a medication because of this test, do not take that medication.  Ladies, please do not wear dresses.  Skirts or pants are appropriate. Please wear a short sleeve shirt.  No perfume, cologne or lotion.  Wear comfortable walking shoes. No heels!            Follow-Up: Your physician recommends that you schedule a follow-up appointment as needed.   Any Other Special Instructions Will Be Listed Below (If Applicable).     If you need a refill on your cardiac medications before your next appointment, please call your pharmacy.  Cardiac Nuclear Scanning A cardiac nuclear scan is used to check your heart for problems, such as the following:  A portion of the heart is not getting enough blood.  Part of the heart muscle has died, which happens with a heart attack.  The heart wall is not working normally.  In this test, a radioactive dye (tracer) is injected into your bloodstream. After the tracer has traveled to your heart, a scanning device is used to measure how much of the tracer is absorbed by or distributed to various areas of your heart. LET [facility] CARE PROVIDER KNOW ABOUT:  Any allergies you have.  All medicines you are taking, including vitamins, herbs, eye drops, creams, and over-the-counter medicines.  Previous problems you or members of your family have had with the use of anesthetics.  Any blood disorders you have.  Previous surgeries you have had.  Medical conditions you have.  RISKS AND COMPLICATIONS Generally, this is a safe procedure. However,  as with any procedure, problems can occur. Possible problems include:   Serious chest pain.  Rapid heartbeat.  Sensation of warmth in your chest. This usually passes quickly. BEFORE THE PROCEDURE Ask your health care provider about changing or stopping your regular medicines. PROCEDURE This procedure is usually done at a hospital and takes 2-4  hours.  An IV tube is inserted into one of your veins.  Your health care provider will inject a small amount of radioactive tracer through the tube.  You will then wait for 20-40 minutes while the tracer travels through your bloodstream.  You will lie down on an exam table so images of your heart can be taken. Images will be taken for about 15-20 minutes.  You will exercise on a treadmill or stationary bike. While you exercise, your heart activity will be monitored with an electrocardiogram (ECG), and your blood pressure will be checked.  If you are unable to exercise, you may be given a medicine to make your heart beat faster.  When blood flow to your heart has peaked, tracer will again be injected through the IV tube.  After 20-40 minutes, you will get back on the exam table and have more images taken of your heart.  When the procedure is over, your IV tube will be removed. AFTER THE PROCEDURE  You will likely be able to leave shortly after the test. Unless your health care provider tells you otherwise, you may return to your normal schedule, including diet, activities, and medicines.  Make sure you find out how and when you will get your test results.   This information is not intended to replace advice given to you by your health care provider. Make sure you discuss any questions you have with your health care provider.   Document Released: 02/20/2004 Document Revised: 01/30/2013 Document Reviewed: 01/03/2013 Elsevier Interactive Patient Education [employer].

## 2015-04-14 ENCOUNTER — Telehealth: Payer: Self-pay | Admitting: Cardiovascular Disease

## 2015-04-14 NOTE — Telephone Encounter (Signed)
Pt calling to let us know he has had to cancel Myoview due to having insurance issues.  He received a call about instructions on the test.  He is just letting us know.

## 2015-04-14 NOTE — Telephone Encounter (Signed)
S/w pt who cancelled myoview stating it was going to cost him $1500.  Offered to see if MD recommends GXT instead of myoview. Pt states "I think I'm fine. I've had my physical. I'm through with doctors" Advised pt to contact us if he decides to reschedule.

## 2015-04-14 NOTE — Telephone Encounter (Signed)
Left detailed message on pt home VM regarding myoview instructions. Provided CB number if questions.

## 2015-05-12 ENCOUNTER — Ambulatory Visit: Payer: Managed Care, Other (non HMO) | Admitting: Cardiovascular Disease

## 2015-10-23 ENCOUNTER — Emergency Department: Payer: Managed Care, Other (non HMO)

## 2015-10-23 ENCOUNTER — Emergency Department
Admission: EM | Admit: 2015-10-23 | Discharge: 2015-10-23 | Disposition: A | Discharge status: Home / Self Care | Payer: Managed Care, Other (non HMO) | Attending: Emergency Medicine | Admitting: Emergency Medicine

## 2015-10-23 ENCOUNTER — Encounter: Payer: Self-pay | Admitting: Emergency Medicine

## 2015-10-23 DIAGNOSIS — M25572 Pain in left ankle and joints of left foot: Secondary | ICD-10-CM | POA: Diagnosis present

## 2015-10-23 DIAGNOSIS — Y999 Unspecified external cause status: Secondary | ICD-10-CM | POA: Diagnosis not present

## 2015-10-23 DIAGNOSIS — S93402A Sprain of unspecified ligament of left ankle, initial encounter: Secondary | ICD-10-CM | POA: Diagnosis not present

## 2015-10-23 DIAGNOSIS — Y939 Activity, unspecified: Secondary | ICD-10-CM | POA: Diagnosis not present

## 2015-10-23 DIAGNOSIS — Y929 Unspecified place or not applicable: Secondary | ICD-10-CM | POA: Diagnosis not present

## 2015-10-23 DIAGNOSIS — Z87891 Personal history of nicotine dependence: Secondary | ICD-10-CM | POA: Insufficient documentation

## 2015-10-23 DIAGNOSIS — X58XXXA Exposure to other specified factors, initial encounter: Secondary | ICD-10-CM | POA: Insufficient documentation

## 2015-10-23 MED ORDER — NAPROXEN 500 MG PO TABS
500.0000 mg | ORAL_TABLET | Freq: Once | ORAL | Status: AC
  Administered 2015-10-23: 500 mg via ORAL
  Filled 2015-10-23: qty 1

## 2015-10-23 MED ORDER — TRAMADOL HCL 50 MG PO TABS
50.0000 mg | ORAL_TABLET | Freq: Once | ORAL | Status: AC
  Administered 2015-10-23: 50 mg via ORAL
  Filled 2015-10-23: qty 1

## 2015-10-23 NOTE — Discharge Instructions (Signed)
Wear splint and ambulate with crutches for 3-5 days. Continue previous medications. Follow orthopedics was no improvement or worsening of complaint.

## 2015-10-23 NOTE — ED Triage Notes (Signed)
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 Pt denies any known injury. Pt states he was unable to stand. Pt maintains sensation and movement at this time.

## 2015-10-23 NOTE — ED Notes (Signed)
Pt presents with ankle and foot pain since this afternoon. 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 NAD noted.

## 2015-10-23 NOTE — ED Provider Notes (Signed)
[facility] Emergency Department Provider Note   ____________________________________________   None    (approximate)  I have reviewed the triage vital signs and the nursing notes.   HISTORY  Chief Complaint Ankle Pain    HPI Timothy Carrillo is a 65 y.o. male patient complaining of left ankle pain onset prior to arrival. 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 Patient state he had temp to stand up and felt a pop in his ankle onset of pain and swelling. She states since the incident he is unable to bear weight without discomfort. Patient denies any loss of sensation or movement. Patient rates pain as a 10 over 10. No palliative measure prior to arrival.   Past Medical History:  Diagnosis Date  . Allergy   . Arthritis   . Chicken pox   . Migraines   . Substance abuse 2006   40 pk year tobacco     Patient Active Problem List   Diagnosis Date Noted  . Fatigue 03/15/2015  . Visit for preventive health examination 03/15/2015  . BPH associated with nocturia 03/15/2015  . Personal history of colonic polyps 11/07/2012  . GERD (gastroesophageal reflux disease) 11/07/2012  . Lung nodule seen on imaging study 09/11/2012  . Prostate cancer screening 09/11/2012  . Reflux esophagitis 09/01/2012  . Arthritis     Past Surgical History:  Procedure Laterality Date  . APPENDECTOMY  1965  . EYE SURGERY Bilateral 2012   2013  . HERNIA REPAIR Right 2011  . HERNIA REPAIR Right 2012   redo of mesh , [place]  . HIP SURGERY Right 2013    Prior to Admission medications   Medication Sig Start Date End Date Taking? Authorizing Provider  dexlansoprazole (DEXILANT) 60 MG capsule Take 60 mg by mouth every other day.    Historical Provider, MD  naproxen sodium (ANAPROX) 220 MG tablet Take 220 mg by mouth every other day.     Historical Provider, MD  tadalafil (CIALIS) 20 MG tablet Take 0.5 tablets (10 mg total) by mouth  daily. For  BPH    AUA score : 25 03/13/15   Crecencio Mc, MD  traMADol (ULTRAM) 50 MG tablet Take 1 tablet (50 mg total) by mouth every 8 (eight) hours as needed. 03/13/15   Crecencio Mc, MD    Allergies Review of patient's allergies indicates no known allergies.  Family History  Problem Relation Age of Onset  . Stroke Mother   . Cancer Father     leukemia    Social History Social History  Substance Use Topics  . Smoking status: Former Smoker    Types: Cigarettes    Quit date: 05/08/2004  . Smokeless tobacco: Never Used  . Alcohol use 5.0 oz/week    10 Standard drinks or equivalent per week     Comment: occasionally    Review of Systems Constitutional: No fever/chills Eyes: No visual changes. ENT: No sore throat. Cardiovascular: Denies chest pain. Respiratory: Denies shortness of breath. Gastrointestinal: No abdominal pain.  No nausea, no vomiting.  No diarrhea.  No constipation. Genitourinary: Negative for dysuria. Musculoskeletal:Lateral ankle pain and edema  Skin: Negative for rash. Neurological: Negative for headaches, focal weakness or numbness.    ____________________________________________   PHYSICAL EXAM:  VITAL SIGNS: ED Triage Vitals  Enc Vitals Group     BP 10/23/15 1817 (!) 145/85     Pulse Rate 10/23/15 1817 76     Resp  10/23/15 1817 18     Temp 10/23/15 1817 98.1 F (36.7 C)     Temp Source 10/23/15 1817 Oral     SpO2 10/23/15 1817 98 %     Weight 10/23/15 1817 170 lb (77.1 kg)     Height 10/23/15 1817 5\' 9"  (1.753 m)     Head Circumference --      Peak Flow --      Pain Score 10/23/15 1824 10     Pain Loc --      Pain Edu? --      Excl. in [place]? --     Constitutional: Alert and oriented. Well appearing and in no acute distress. Eyes: Conjunctivae are normal. PERRL. EOMI. Head: Atraumatic. Nose: No congestion/rhinnorhea. Mouth/Throat: Mucous membranes are moist.  Oropharynx non-erythematous. Neck: No stridor.  No cervical spine  tenderness to palpation. Hematological/Lymphatic/Immunilogical: No cervical lymphadenopathy. Cardiovascular: Normal rate, regular rhythm. Grossly normal heart sounds.  Good peripheral circulation. Respiratory: Normal respiratory effort.  No retractions. Lungs CTAB. Gastrointestinal: Soft and nontender. No distention. No abdominal bruits. No CVA tenderness. Genitourinary:  Musculoskeletal: No lower extremity tenderness nor edema.  No joint effusions. Neurologic:  Normal speech and language. No gross focal neurologic deficits are appreciated. No gait instability. Skin:  Skin is warm, dry and intact. No rash noted. Psychiatric: Mood and affect are normal. Speech and behavior are normal.  ____________________________________________   LABS (all labs ordered are listed, but only abnormal results are displayed)  Labs Reviewed - No data to display ____________________________________________  EKG   ____________________________________________  RADIOLOGY  No acute findings on x-ray of the left ankle. Moderate effusion is noted. ____________________________________________   PROCEDURES  Procedure(s) performed: None  Procedures  Critical Care performed: No  ____________________________________________   INITIAL IMPRESSION / ASSESSMENT AND PLAN / ED COURSE  Pertinent labs & imaging results that were available during my care of the patient were reviewed by me and considered in my medical decision making (see chart for details).  Left lateral ankle sprain. Discussed x-ray finding with patient. Patient placed in a posterior ankle splint and given crutches for ambulation. Patient advised to follow orthopedics clinic if no improvement 3-5 days.  Clinical Course     ____________________________________________   FINAL CLINICAL IMPRESSION(S) / ED DIAGNOSES  Final diagnoses:  Left ankle sprain, initial encounter      NEW MEDICATIONS STARTED DURING THIS VISIT:  New  Prescriptions   No medications on file     Note:  This document was prepared using Dragon voice recognition software and may include unintentional dictation errors.    Sable Feil, PA-C 10/23/15 1907    Hinda Kehr, MD 10/23/15 2122

## 2016-01-28 ENCOUNTER — Encounter: Payer: Self-pay | Admitting: Physician Assistant

## 2016-01-28 ENCOUNTER — Ambulatory Visit: Payer: Self-pay | Admitting: Physician Assistant

## 2016-01-28 VITALS — BP 130/75 | HR 82 | Temp 98.2°F

## 2016-01-28 DIAGNOSIS — L03119 Cellulitis of unspecified part of limb: Secondary | ICD-10-CM

## 2016-01-28 MED ORDER — SULFAMETHOXAZOLE-TRIMETHOPRIM 800-160 MG PO TABS: 1.0000 | ORAL_TABLET | Freq: Two times a day (BID) | ORAL | 0 refills | Status: DC

## 2016-01-28 NOTE — Progress Notes (Signed)
S: c/o left elbow being red and swollen states the swelling was worse yesterday, hurts for anything to touch it but doesn't have pain with movement, feels like an abscessed tooth, no fever/chills, no hx of gout  O: vitals wnl, nad, left elbow with some fluid like swelling, redness and warmth extend to forearm and above elbow, full rom, no bony tender, n/v intact  A: cellulitis/bursitis of left elbow  P: septra ds, increase naproxen to bid, return if worsening or not improving in 3-5 days

## 2016-02-04 ENCOUNTER — Encounter: Payer: Self-pay | Admitting: Physician Assistant

## 2016-02-04 ENCOUNTER — Ambulatory Visit: Payer: Self-pay | Admitting: Physician Assistant

## 2016-02-04 VITALS — BP 130/79 | HR 67 | Temp 97.7°F

## 2016-02-04 DIAGNOSIS — M7022 Olecranon bursitis, left elbow: Secondary | ICD-10-CM

## 2016-02-04 NOTE — Progress Notes (Signed)
S: c/oleft elbow being swollen, redness is better, fluid has built up at elbow, no fever/chills, area is not as sore as it was previously, no loss of motion, no numbness or tingling  O: vitals wnl, nad, skin on left elbow is a little red, + olecranon bursa swelling, area is fluctuant, full rom, no bony tenderness, n/v intact  A: olecranon bursitis  P: elbow brace, ice, nsaids, if worsening will refer to ortho

## 2016-03-31 ENCOUNTER — Telehealth: Payer: Self-pay | Admitting: *Deleted

## 2016-03-31 ENCOUNTER — Ambulatory Visit (INDEPENDENT_AMBULATORY_CARE_PROVIDER_SITE_OTHER): Payer: PPO | Admitting: Internal Medicine

## 2016-03-31 ENCOUNTER — Encounter: Payer: Self-pay | Admitting: Internal Medicine

## 2016-03-31 VITALS — BP 142/86 | HR 80 | Resp 16 | Ht 69.0 in | Wt 170.0 lb

## 2016-03-31 DIAGNOSIS — Z125 Encounter for screening for malignant neoplasm of prostate: Secondary | ICD-10-CM

## 2016-03-31 DIAGNOSIS — E7800 Pure hypercholesterolemia, unspecified: Secondary | ICD-10-CM | POA: Diagnosis not present

## 2016-03-31 DIAGNOSIS — K2100 Gastro-esophageal reflux disease with esophagitis, without bleeding: Secondary | ICD-10-CM

## 2016-03-31 DIAGNOSIS — R5383 Other fatigue: Secondary | ICD-10-CM | POA: Diagnosis not present

## 2016-03-31 DIAGNOSIS — Z86010 Personal history of colonic polyps: Secondary | ICD-10-CM

## 2016-03-31 DIAGNOSIS — Z0000 Encounter for general adult medical examination without abnormal findings: Secondary | ICD-10-CM | POA: Diagnosis not present

## 2016-03-31 DIAGNOSIS — R030 Elevated blood-pressure reading, without diagnosis of hypertension: Secondary | ICD-10-CM | POA: Diagnosis not present

## 2016-03-31 DIAGNOSIS — M1990 Unspecified osteoarthritis, unspecified site: Secondary | ICD-10-CM

## 2016-03-31 LAB — LIPID PANEL
Cholesterol: 186 mg/dL (ref 0–200)
HDL: 52 mg/dL (ref >39.00)
LDL Cholesterol: 111 mg/dL — ABNORMAL HIGH (ref 0–99)
NonHDL: 133.56
Total CHOL/HDL Ratio: 4
Triglycerides: 115 mg/dL (ref 0.0–149.0)
VLDL: 23 mg/dL (ref 0.0–40.0)

## 2016-03-31 LAB — COMPREHENSIVE METABOLIC PANEL
ALBUMIN: 4.4 g/dL (ref 3.5–5.2)
ALK PHOS: 74 U/L (ref 39–117)
ALT: 12 U/L (ref 0–53)
AST: 14 U/L (ref 0–37)
BILIRUBIN TOTAL: 0.3 mg/dL (ref 0.2–1.2)
BUN: 12 mg/dL (ref 6–23)
CALCIUM: 9.7 mg/dL (ref 8.4–10.5)
CO2: 28 mEq/L (ref 19–32)
Chloride: 104 mEq/L (ref 96–112)
Creatinine, Ser: 0.88 mg/dL (ref 0.40–1.50)
GFR: 91.55 mL/min (ref >60.00)
GLUCOSE: 105 mg/dL — AB (ref 70–99)
Potassium: 4.4 mEq/L (ref 3.5–5.1)
Sodium: 140 mEq/L (ref 135–145)
TOTAL PROTEIN: 7 g/dL (ref 6.0–8.3)

## 2016-03-31 LAB — TSH: TSH: 1.5 u[IU]/mL (ref 0.35–4.50)

## 2016-03-31 LAB — PSA, MEDICARE: PSA: 3.45 ng/ml (ref 0.10–4.00)

## 2016-03-31 LAB — LDL CHOLESTEROL, DIRECT: Direct LDL: 102 mg/dL

## 2016-03-31 MED ORDER — TRAMADOL HCL 50 MG PO TABS: 50.0000 mg | ORAL_TABLET | Freq: Three times a day (TID) | ORAL | 3 refills | Status: DC | PRN

## 2016-03-31 NOTE — Telephone Encounter (Signed)
Error

## 2016-03-31 NOTE — Patient Instructions (Addendum)
You have declined the recommended Pneumonia vaccine, (in spite of having mild emphysema!))  If you change your mind, please return!   Your blood pressure was high today and last time you were here as well.  The new goals for optimal blood pressure management are 120/70.  Please check your blood pressure a few times at home and send me the readings so I can determine if you need a medication   Health Maintenance, Male A healthy lifestyle and preventative care can promote health and wellness.  Maintain regular health, dental, and eye exams.  Eat a healthy diet. Foods like vegetables, fruits, whole grains, low-fat dairy products, and lean protein foods contain the nutrients you need and are low in calories. Decrease your intake of foods high in solid fats, added sugars, and salt. Get information about a proper diet from your health care provider, if necessary.  Regular physical exercise is one of the most important things you can do for your health. Most adults should get at least 150 minutes of moderate-intensity exercise (any activity that increases your heart rate and causes you to sweat) each week. In addition, most adults need muscle-strengthening exercises on 2 or more days a week.   Maintain a healthy weight. The body mass index (BMI) is a screening tool to identify possible weight problems. It provides an estimate of body fat based on height and weight. Your health care provider can find your BMI and can help you achieve or maintain a healthy weight. For males 20 years and older:  A BMI below 18.5 is considered underweight.  A BMI of 18.5 to 24.9 is normal.  A BMI of 25 to 29.9 is considered overweight.  A BMI of 30 and above is considered obese.  Maintain normal blood lipids and cholesterol by exercising and minimizing your intake of saturated fat. Eat a balanced diet with plenty of fruits and vegetables. Blood tests for lipids and cholesterol should begin at age 6 and be repeated  every 5 years. If your lipid or cholesterol levels are high, you are over age 89, or you are at high risk for heart disease, you may need your cholesterol levels checked more frequently.Ongoing high lipid and cholesterol levels should be treated with medicines if diet and exercise are not working.  If you smoke, find out from your health care provider how to quit. If you do not use tobacco, do not start.  Lung cancer screening is recommended for adults aged 98-80 years who are at high risk for developing lung cancer because of a history of smoking. A yearly low-dose CT scan of the lungs is recommended for people who have at least a 30-pack-year history of smoking and are current smokers or have quit within the past 15 years. A pack year of smoking is smoking an average of 1 pack of cigarettes a day for 1 year (for example, a 30-pack-year history of smoking could mean smoking 1 pack a day for 30 years or 2 packs a day for 15 years). Yearly screening should continue until the smoker has stopped smoking for at least 15 years. Yearly screening should be stopped for people who develop a health problem that would prevent them from having lung cancer treatment.  If you choose to drink alcohol, do not have more than 2 drinks per day. One drink is considered to be 12 oz (360 mL) of beer, 5 oz (150 mL) of wine, or 1.5 oz (45 mL) of liquor.  Avoid the use of  street drugs. Do not share needles with anyone. Ask for help if you need support or instructions about stopping the use of drugs.  High blood pressure causes heart disease and increases the risk of stroke. High blood pressure is more likely to develop in:  People who have blood pressure in the end of the normal range (100-139/85-89 mm Hg).  People who are overweight or obese.  People who are African American.  If you are 73-27 years of age, have your blood pressure checked every 3-5 years. If you are 20 years of age or older, have your blood pressure  checked every year. You should have your blood pressure measured twice-once when you are at a hospital or clinic, and once when you are not at a hospital or clinic. Record the average of the two measurements. To check your blood pressure when you are not at a hospital or clinic, you can use:  An automated blood pressure machine at a pharmacy.  A home blood pressure monitor.  If you are 92-60 years old, ask your health care provider if you should take aspirin to prevent heart disease.  Diabetes screening involves taking a blood sample to check your fasting blood sugar level. This should be done once every 3 years after age 49 if you are at a normal weight and without risk factors for diabetes. Testing should be considered at a younger age or be carried out more frequently if you are overweight and have at least 1 risk factor for diabetes.  Colorectal cancer can be detected and often prevented. Most routine colorectal cancer screening begins at the age of 37 and continues through age 13. However, your health care provider may recommend screening at an earlier age if you have risk factors for colon cancer. On a yearly basis, your health care provider may provide home test kits to check for hidden blood in the stool. A small camera at the end of a tube may be used to directly examine the colon (sigmoidoscopy or colonoscopy) to detect the earliest forms of colorectal cancer. Talk to your health care provider about this at age 53 when routine screening begins. A direct exam of the colon should be repeated every 5-10 years through age 57, unless early forms of precancerous polyps or small growths are found.  People who are at an increased risk for hepatitis B should be screened for this virus. You are considered at high risk for hepatitis B if:  You were born in a country where hepatitis B occurs often. Talk with your health care provider about which countries are considered high risk.  Your parents were  born in a high-risk country and you have not received a shot to protect against hepatitis B (hepatitis B vaccine).  You have HIV or AIDS.  You use needles to inject street drugs.  You live with, or have sex with, someone who has hepatitis B.  You are a man who has sex with other men (MSM).  You get hemodialysis treatment.  You take certain medicines for conditions like cancer, organ transplantation, and autoimmune conditions.  Hepatitis C blood testing is recommended for all people born from [birth year] through 1967 and any individual with known risk factors for hepatitis C.  Healthy men should no longer receive prostate-specific antigen (PSA) blood tests as part of routine cancer screening. Talk to your health care provider about prostate cancer screening.  Testicular cancer screening is not recommended for adolescents or adult males who have no symptoms. Screening  includes self-exam, a health care provider exam, and other screening tests. Consult with your health care provider about any symptoms you have or any concerns you have about testicular cancer.  Practice safe sex. Use condoms and avoid high-risk sexual practices to reduce the spread of sexually transmitted infections (STIs).  You should be screened for STIs, including gonorrhea and chlamydia if:  You are sexually active and are younger than 24 years.  You are older than 24 years, and your health care provider tells you that you are at risk for this type of infection.  Your sexual activity has changed since you were last screened, and you are at an increased risk for chlamydia or gonorrhea. Ask your health care provider if you are at risk.  If you are at risk of being infected with HIV, it is recommended that you take a prescription medicine daily to prevent HIV infection. This is called pre-exposure prophylaxis (PrEP). You are considered at risk if:  You are a man who has sex with other men (MSM).  You are a heterosexual man who  is sexually active with multiple partners.  You take drugs by injection.  You are sexually active with a partner who has HIV.  Talk with your health care provider about whether you are at high risk of being infected with HIV. If you choose to begin PrEP, you should first be tested for HIV. You should then be tested every 3 months for as long as you are taking PrEP.  Use sunscreen. Apply sunscreen liberally and repeatedly throughout the day. You should seek shade when your shadow is shorter than you. Protect yourself by wearing long sleeves, pants, a wide-brimmed hat, and sunglasses year round whenever you are outdoors.  Tell your health care provider of new moles or changes in moles, especially if there is a change in shape or color. Also, tell your health care provider if a mole is larger than the size of a pencil eraser.  A one-time screening for abdominal aortic aneurysm (AAA) and surgical repair of large AAAs by ultrasound is recommended for men aged 54-75 years who are current or former smokers.  Stay current with your vaccines (immunizations). This information is not intended to replace advice given to you by your health care provider. Make sure you discuss any questions you have with your health care provider. Document Released: 07/24/2007 Document Revised: 02/15/2014 Document Reviewed: 10/29/2014 Elsevier Interactive Patient Education  2017 [employer].

## 2016-03-31 NOTE — Progress Notes (Signed)
Pre visit review using our clinic review tool, if applicable. No additional management support is needed unless otherwise documented below in the visit note. 

## 2016-03-31 NOTE — Progress Notes (Addendum)
Patient ID: Timothy Carrillo, male    DOB: [birth year]  Age: 66 y.o. MRN: [id number]  The patient is here for  management of other chronic and acute problems.    colonic polyps 2014 adenomatous,  due now  The risk factors are reflected in the social history.  The roster of all physicians providing medical care to patient - is listed in the Snapshot section of the chart.  Activities of daily living:  The patient is 100% independent in all ADLs: dressing, toileting, feeding as well as independent mobility  Home safety : The patient has smoke detectors in the home. They wear seatbelts.  There are no firearms at home. There is no violence in the home.   There is no risks for hepatitis, STDs or HIV. There is no   history of blood transfusion. They have no travel history to infectious disease endemic areas of the world.  The patient has seen their dentist in the last six month. They have seen their eye doctor in the last year. They admit to slight hearing difficulty with regard to whispered voices and some television programs.  They have deferred audiologic testing in the last year.  They do not  have excessive sun exposure. Discussed the need for sun protection: hats, long sleeves and use of sunscreen if there is significant sun exposure.   Diet: the importance of a healthy diet is discussed. They do have a healthy diet.  The benefits of regular aerobic exercise were discussed.  He  Kayaks , fishes   CAMPS AND golfs.  Has no dyspnea with these activities.   Depression screen: there are no signs or vegative symptoms of depression- irritability, change in appetite, anhedonia, sadness/tearfullness.  Cognitive assessment: the patient manages all their financial and personal affairs and is actively engaged. They could relate day,date,year and events; recalled 2/3 objects at 3 minutes; performed clock-face test normally.  The following portions of the patient's history were reviewed and updated as  appropriate: allergies, current medications, past family history, past medical history,  past surgical history, past social history  and problem list.  Visual acuity was not assessed per patient preference since she has regular follow up with her ophthalmologist. Hearing and body mass index were assessed and reviewed.   During the course of the visit the patient was educated and counseled about appropriate screening and preventive services including : fall prevention , diabetes screening, nutrition counseling, colorectal cancer screening, and recommended immunizations.    CC: The primary encounter diagnosis was Hx of adenomatous colonic polyps. Diagnoses of Arthritis, Prostate cancer screening, Pure hypercholesterolemia, Fatigue, unspecified type, Medicare annual wellness visit, subsequent, Elevated blood pressure reading in office without diagnosis of hypertension, and Reflux esophagitis were also pertinent to this visit.   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   Wore a n ankle brace for a week (not to bed ) and symptoms resolved after a week      History [place] has a past medical history of Allergy; Arthritis; Chicken pox; Migraines; and Substance abuse (2006).   [name]; Eye surgery (Bilateral, 2012); Hip surgery (Right, 2013); Hernia repair (Right, 2011); and Hernia repair (Right, 2012).   His family history includes Cancer in his father; Stroke in his mother.He reports that he quit smoking about 11 years ago. His smoking use included Cigarettes.  He has never used smokeless tobacco. He reports that he drinks about 5.0 oz of alcohol per week . He reports that he does not use drugs.  Outpatient Medications Prior to Visit  Medication Sig Dispense Refill  . naproxen sodium (ANAPROX) 220 MG tablet Take 220 mg by mouth every other day.      . traMADol (ULTRAM) 50 MG tablet Take 1 tablet (50 mg total) by mouth every 8 (eight) hours as needed. 90 tablet 3  . dexlansoprazole (DEXILANT) 60 MG capsule Take 60 mg by mouth every other day.    . tadalafil (CIALIS) 20 MG tablet Take 0.5 tablets (10 mg total) by mouth daily. For  BPH    AUA score : 25 (Patient not taking: Reported on 03/31/2016) 30 tablet 11   No facility-administered medications prior to visit.     Review of Systems   Patient denies headache, fevers, malaise, unintentional weight loss, skin rash, eye pain, sinus congestion and sinus pain, sore throat, dysphagia,  hemoptysis , cough, dyspnea, wheezing, chest pain, palpitations, orthopnea, edema, abdominal pain, nausea, melena, diarrhea, constipation, flank pain, dysuria, hematuria, urinary  Frequency, nocturia, numbness, tingling, seizures,  Focal weakness, Loss of consciousness,  Tremor, insomnia, depression, anxiety, and suicidal ideation.     Objective:  BP (!) 142/86   Pulse 80   Resp 16   Ht 5\' 9"  (1.753 m)   Wt 170 lb (77.1 kg)   SpO2 97%   BMI 25.10 kg/m   Physical Exam   General appearance: alert, cooperative and appears stated age Ears: normal TM's and external ear canals both ears Throat: lips, mucosa, and tongue normal; teeth and gums normal Neck: no adenopathy, no carotid bruit, supple, symmetrical, trachea midline and thyroid not enlarged, symmetric, no tenderness/mass/nodules Back: symmetric, no curvature. ROM normal. No CVA tenderness. Lungs: clear to auscultation bilaterally Heart: regular rate and rhythm, S1, S2 normal, no murmur, click, rub or gallop Abdomen: soft, non-tender; bowel sounds normal; no masses,  no organomegaly Pulses: 2+ and symmetric Skin: Skin color, texture, turgor normal. No rashes or lesions Lymph nodes: Cervical, supraclavicular, and axillary nodes normal.   Assessment & Plan:   Problem List Items Addressed This Visit    Arthritis    Managed with prn naproxen and  tramadol.       Relevant Medications   traMADol (ULTRAM) 50 MG tablet   Elevated blood pressure reading in office without diagnosis of hypertension    He has no prior history of hypertension. He will check his blood pressure several times over the next 3-4 weeks and to submit readings for evaluation.   Lab Results  Component Value Date   CREATININE 0.88 03/31/2016   Lab Results  Component Value Date   NA 140 03/31/2016   K 4.4 03/31/2016   CL 104 03/31/2016   CO2 28 03/31/2016   No results found for: MICROALBUR, [id number]       Fatigue   Relevant Orders   Comprehensive metabolic panel (Completed)   TSH (Completed)   Hyperlipidemia    based on current lipid profile, the risk of clinically significant Coronary artery disease is 20% over the next 10 years, using the Framingham risk calculator.  Statin recommended      Relevant Orders   Lipid panel (Completed)   LDL cholesterol, direct (Completed)   Medicare annual wellness visit, subsequent    Annual wellness exam was done .During the course of the visit the patient was educated and counseled about appropriate screening and  preventive services including :  diabetes screening, lipid analysis with projected  10 year  risk for CAD , nutrition counseling, and colorectal cancer screening, and recommended immunizations.  Printed recommendations for health maintenance screenings was given      Prostate cancer screening    PSA is normal but has been rising since 2014   BPH suspected based on report of nocturia.   Lab Results  Component Value Date   PSA 3.45 03/31/2016   PSA 3.03 03/13/2015   PSA 1.35 09/07/2012         Relevant Orders   PSA, Medicare (Completed)   Reflux esophagitis    Mild, found On 2014 EGD for chest pain.  Managed with dexilant every other day         Other Visit Diagnoses    Hx of adenomatous colonic polyps    -  Primary   Relevant Orders   Ambulatory referral to Gastroenterology      I have  discontinued Mr. Cavataio tadalafil and dexlansoprazole. I am also having him maintain his naproxen sodium, omeprazole, and traMADol.  Meds ordered this encounter  Medications  . omeprazole (PRILOSEC) 20 MG capsule    Sig: Take 20 mg by mouth daily as needed.  . traMADol (ULTRAM) 50 MG tablet    Sig: Take 1 tablet (50 mg total) by mouth every 8 (eight) hours as needed.    Dispense:  90 tablet    Refill:  3    Medications Discontinued During This Encounter  Medication Reason  . dexlansoprazole (DEXILANT) 60 MG capsule Patient has not taken in last 30 days  . traMADol (ULTRAM) 50 MG tablet Reorder  . tadalafil (CIALIS) 20 MG tablet     Follow-up: No Follow-up on file.   Crecencio Mc, MD

## 2016-04-01 NOTE — Assessment & Plan Note (Signed)
Annual wellness exam was done .During the course of the visit the patient was educated and counseled about appropriate screening and preventive services including :  diabetes screening, lipid analysis with projected  10 year  risk for CAD , nutrition counseling, and colorectal cancer screening, and recommended immunizations.  Printed recommendations for health maintenance screenings was given

## 2016-04-04 DIAGNOSIS — E785 Hyperlipidemia, unspecified: Secondary | ICD-10-CM | POA: Insufficient documentation

## 2016-04-04 DIAGNOSIS — R030 Elevated blood-pressure reading, without diagnosis of hypertension: Secondary | ICD-10-CM | POA: Insufficient documentation

## 2016-04-04 DIAGNOSIS — I10 Essential (primary) hypertension: Secondary | ICD-10-CM | POA: Insufficient documentation

## 2016-04-04 NOTE — Assessment & Plan Note (Signed)
based on current lipid profile, the risk of clinically significant Coronary artery disease is 20% over the next 10 years, using the Framingham risk calculator.  Statin recommended

## 2016-04-04 NOTE — Assessment & Plan Note (Addendum)
He has no prior history of hypertension. He will check his blood pressure several times over the next 3-4 weeks and to submit readings for evaluation.   Lab Results  Component Value Date   CREATININE 0.88 03/31/2016   Lab Results  Component Value Date   NA 140 03/31/2016   K 4.4 03/31/2016   CL 104 03/31/2016   CO2 28 03/31/2016   No results found for: Derl Barrow

## 2016-04-04 NOTE — Assessment & Plan Note (Signed)
Mild, found On 2014 EGD for chest pain.  Managed with dexilant every other day

## 2016-04-04 NOTE — Assessment & Plan Note (Signed)
Managed with prn naproxen and tramadol.

## 2016-04-04 NOTE — Assessment & Plan Note (Signed)
PSA is normal but has been rising since 2014   BPH suspected based on report of nocturia.   Lab Results  Component Value Date   PSA 3.45 03/31/2016   PSA 3.03 03/13/2015   PSA 1.35 09/07/2012

## 2016-04-05 ENCOUNTER — Encounter: Payer: Self-pay | Admitting: Internal Medicine

## 2016-04-06 ENCOUNTER — Telehealth: Payer: Self-pay

## 2016-04-06 DIAGNOSIS — E785 Hyperlipidemia, unspecified: Secondary | ICD-10-CM

## 2016-04-06 NOTE — Progress Notes (Signed)
Pt requested his labs to be mailed to him. Printed and put up front to be mailed.

## 2016-04-06 NOTE — Telephone Encounter (Signed)
-----   Message from Crecencio Mc, MD sent at 04/05/2016  4:00 PM EST ----- Yes, he does

## 2016-04-06 NOTE — Telephone Encounter (Signed)
Labs have been ordered and appt has been scheduled for 04/12/2016 @ 8:45am. Pt is aware of appt date and time.

## 2016-04-12 ENCOUNTER — Other Ambulatory Visit (INDEPENDENT_AMBULATORY_CARE_PROVIDER_SITE_OTHER): Payer: PPO

## 2016-04-12 DIAGNOSIS — E785 Hyperlipidemia, unspecified: Secondary | ICD-10-CM | POA: Diagnosis not present

## 2016-04-12 LAB — LIPID PANEL
CHOL/HDL RATIO: 4
Cholesterol: 176 mg/dL (ref 0–200)
HDL: 48.7 mg/dL (ref >39.00)
LDL Cholesterol: 104 mg/dL — ABNORMAL HIGH (ref 0–99)
NONHDL: 127.48
TRIGLYCERIDES: 115 mg/dL (ref 0.0–149.0)
VLDL: 23 mg/dL (ref 0.0–40.0)

## 2016-06-04 DIAGNOSIS — K219 Gastro-esophageal reflux disease without esophagitis: Secondary | ICD-10-CM | POA: Diagnosis not present

## 2016-06-04 DIAGNOSIS — Z86010 Personal history of colonic polyps: Secondary | ICD-10-CM | POA: Diagnosis not present

## 2016-06-04 DIAGNOSIS — R143 Flatulence: Secondary | ICD-10-CM | POA: Diagnosis not present

## 2016-09-03 ENCOUNTER — Encounter
Admission: RE | Disposition: A | Discharge status: Home / Self Care | Payer: Self-pay | Source: Ambulatory Visit | Attending: Unknown Physician Specialty

## 2016-09-03 ENCOUNTER — Ambulatory Visit
Admission: RE | Admit: 2016-09-03 | Discharge: 2016-09-03 | Disposition: A | Discharge status: Home / Self Care | Payer: PPO | Source: Ambulatory Visit | Attending: Unknown Physician Specialty | Admitting: Unknown Physician Specialty

## 2016-09-03 ENCOUNTER — Ambulatory Visit: Payer: PPO | Admitting: Certified Registered"

## 2016-09-03 ENCOUNTER — Encounter: Payer: Self-pay | Admitting: Anesthesiology

## 2016-09-03 DIAGNOSIS — K635 Polyp of colon: Secondary | ICD-10-CM | POA: Insufficient documentation

## 2016-09-03 DIAGNOSIS — Z1211 Encounter for screening for malignant neoplasm of colon: Secondary | ICD-10-CM | POA: Diagnosis not present

## 2016-09-03 DIAGNOSIS — Z86010 Personal history of colonic polyps: Secondary | ICD-10-CM | POA: Diagnosis not present

## 2016-09-03 DIAGNOSIS — Z87891 Personal history of nicotine dependence: Secondary | ICD-10-CM | POA: Diagnosis not present

## 2016-09-03 DIAGNOSIS — K649 Unspecified hemorrhoids: Secondary | ICD-10-CM | POA: Diagnosis not present

## 2016-09-03 DIAGNOSIS — Z791 Long term (current) use of non-steroidal anti-inflammatories (NSAID): Secondary | ICD-10-CM | POA: Diagnosis not present

## 2016-09-03 DIAGNOSIS — K640 First degree hemorrhoids: Secondary | ICD-10-CM | POA: Insufficient documentation

## 2016-09-03 DIAGNOSIS — Z9049 Acquired absence of other specified parts of digestive tract: Secondary | ICD-10-CM | POA: Diagnosis not present

## 2016-09-03 DIAGNOSIS — K219 Gastro-esophageal reflux disease without esophagitis: Secondary | ICD-10-CM | POA: Insufficient documentation

## 2016-09-03 DIAGNOSIS — Z79899 Other long term (current) drug therapy: Secondary | ICD-10-CM | POA: Diagnosis not present

## 2016-09-03 DIAGNOSIS — D123 Benign neoplasm of transverse colon: Secondary | ICD-10-CM | POA: Diagnosis not present

## 2016-09-03 HISTORY — PX: COLONOSCOPY WITH PROPOFOL: SHX5780

## 2016-09-03 LAB — HM COLONOSCOPY

## 2016-09-03 SURGERY — COLONOSCOPY WITH PROPOFOL
Anesthesia: General

## 2016-09-03 MED ORDER — SODIUM CHLORIDE 0.9 % IV SOLN
INTRAVENOUS | Status: DC
  Administered 2016-09-03: 10:00:00 via INTRAVENOUS

## 2016-09-03 MED ORDER — SODIUM CHLORIDE 0.9 % IV SOLN: INTRAVENOUS | Status: DC

## 2016-09-03 MED ORDER — PROPOFOL 500 MG/50ML IV EMUL
INTRAVENOUS | Status: DC | PRN
  Administered 2016-09-03: 150 ug/kg/min via INTRAVENOUS

## 2016-09-03 MED ORDER — LIDOCAINE HCL (CARDIAC) 20 MG/ML IV SOLN
INTRAVENOUS | Status: DC | PRN
  Administered 2016-09-03: 50 mg via INTRAVENOUS

## 2016-09-03 MED ORDER — PROPOFOL 10 MG/ML IV BOLUS
INTRAVENOUS | Status: DC | PRN
  Administered 2016-09-03: 80 mg via INTRAVENOUS

## 2016-09-03 MED ORDER — PROPOFOL 500 MG/50ML IV EMUL
INTRAVENOUS | Status: AC
  Filled 2016-09-03: qty 50

## 2016-09-03 NOTE — Op Note (Signed)
[facility] Gastroenter[facility] Patient Name: Timothy Carrillo Procedure Date: 09/03/2016 10:12 AM MRN: [id number] Account #: [id number] Date of Birth: Oct 25, 1950 Admit Type: Outpatient Age: 66 Room: [facility] ENDO ROOM 1 Gender: Male Note Status: Finalized Procedure:            Colonoscopy Indications:          High risk colon cancer surveillance: Personal history                        of colonic polyps Providers:            Manya Silvas, MD Referring MD:         Deborra Medina, MD (Referring MD) Medicines:            Propofol per Anesthesia Complications:        No immediate complications. Procedure:            Pre-Anesthesia Assessment:                       - After reviewing the risks and benefits, the patient                        was deemed in satisfactory condition to undergo the                        procedure.                       After obtaining informed consent, the colonoscope was                        passed under direct vision. Throughout the procedure,                        the patient's blood pressure, pulse, and oxygen                        saturations were monitored continuously. The                        Colonoscope was introduced through the anus and                        advanced to the the cecum, identified by appendiceal                        orifice and ileocecal valve. The colonoscopy was                        performed without difficulty. The patient tolerated the                        procedure well. The quality of the bowel preparation                        was excellent. Findings:      A very small polyp was found in the transverse colon. The polyp was       sessile. The polyp was removed with a jumbo cold forceps. Resection and       retrieval were complete.      Internal hemorrhoids were found  during endoscopy. The hemorrhoids were       small and Grade I (internal hemorrhoids that do not prolapse).      The exam was  otherwise without abnormality. Impression:           - One small polyp in the transverse colon, removed with                        a jumbo cold forceps. Resected and retrieved.                       - Internal hemorrhoids.                       - The examination was otherwise normal. Recommendation:       - Await pathology results. Manya Silvas, MD 09/03/2016 10:40:13 AM This report has been signed electronically. Number of Addenda: 0 Note Initiated On: 09/03/2016 10:12 AM Scope Withdrawal Time: 0 hours 11 minutes 18 seconds  Total Procedure Duration: 0 hours 15 minutes 0 seconds       [facility]

## 2016-09-03 NOTE — Anesthesia Post-op Follow-up Note (Cosign Needed)
Anesthesia QCDR form completed.        

## 2016-09-03 NOTE — H&P (Signed)
   Primary Care Physician:  Crecencio Mc, MD Primary Gastroenterologist:  Dr. Vira Agar  Pre-Procedure History & Physical: HPI:  AYOMIDE PURDY is a 66 y.o. male is here for an colonoscopy.   Past Medical History:  Diagnosis Date  . Allergy   . Arthritis   . Chicken pox   . Migraines   . Substance abuse 2006   40 pk year tobacco     Past Surgical History:  Procedure Laterality Date  . APPENDECTOMY  1965  . EYE SURGERY Bilateral 2012   2013  . HERNIA REPAIR Right 2011  . HERNIA REPAIR Right 2012   redo of mesh , [place]  . HIP SURGERY Right 2013    Prior to Admission medications   Medication Sig Start Date End Date Taking? Authorizing Provider  naproxen sodium (ANAPROX) 220 MG tablet Take 220 mg by mouth every other day.     [provider]  omeprazole (PRILOSEC) 20 MG capsule Take 20 mg by mouth daily as needed.    [provider]  traMADol (ULTRAM) 50 MG tablet Take 1 tablet (50 mg total) by mouth every 8 (eight) hours as needed. 03/31/16   Crecencio Mc, MD    Allergies as of 06/08/2016  . (No Known Allergies)    Family History  Problem Relation Age of Onset  . Stroke Mother   . Cancer Father        leukemia    Social History   Social History  . Marital status: Married    Spouse name: N/A  . Number of children: N/A  . Years of education: N/A   Occupational History  . carpenter     self employed   Social History Main Topics  . Smoking status: Former Smoker    Types: Cigarettes    Quit date: 05/08/2004  . Smokeless tobacco: Never Used  . Alcohol use 5.0 oz/week    10 Standard drinks or equivalent per week     Comment: occasionally  . Drug use: No  . Sexual activity: Yes    Partners: Female   Other Topics Concern  . Not on file   Social History Narrative  . No narrative on file    Review of Systems: See HPI, otherwise negative ROS  Physical Exam: BP (!) 144/86   Pulse 64   Temp 97.9 F (36.6 C) (Tympanic)   Resp  16   Ht 5\' 9"  (1.753 m)   Wt 71.8 kg (158 lb 6.4 oz)   SpO2 99%   BMI 23.39 kg/m  General:   Alert,  pleasant and cooperative in NAD Head:  Normocephalic and atraumatic. Neck:  Supple; no masses or thyromegaly. Lungs:  Clear throughout to auscultation.    Heart:  Regular rate and rhythm. Abdomen:  Soft, nontender and nondistended. Normal bowel sounds, without guarding, and without rebound.   Neurologic:  Alert and  oriented x4;  grossly normal neurologically.  Impression/Plan: Audelia Acton is here for an colonoscopy to be performed for [facility] colon polyps.  Risks, benefits, limitations, and alternatives regarding  colonoscopy have been reviewed with the patient.  Questions have been answered.  All parties agreeable.   Gaylyn Cheers, MD  09/03/2016, 10:14 AM

## 2016-09-03 NOTE — Transfer of Care (Signed)
Immediate Anesthesia Transfer of Care Note  Patient: Timothy Carrillo  Procedure(s) Performed: Procedure(s): COLONOSCOPY WITH PROPOFOL (N/A)  Patient Location: PACU  Anesthesia Type:General  Level of Consciousness: sedated  Airway & Oxygen Therapy: Patient Spontanous Breathing and Patient connected to nasal cannula oxygen  Post-op Assessment: Report given to RN and Post -op Vital signs reviewed and stable  Post vital signs: Reviewed and stable  Last Vitals:  Vitals:   09/03/16 0953 09/03/16 1043  BP: (!) 144/86 110/61  Pulse: 64 (!) 56  Resp: 16   Temp: 36.6 C     Last Pain:  Vitals:   09/03/16 0953  TempSrc: Tympanic         Complications: No apparent anesthesia complications

## 2016-09-03 NOTE — Anesthesia Postprocedure Evaluation (Signed)
Anesthesia Post Note  Patient: ADIEL ERNEY  Procedure(s) Performed: Procedure(s) (LRB): COLONOSCOPY WITH PROPOFOL (N/A)  Patient location during evaluation: Endoscopy Anesthesia Type: General Level of consciousness: awake and alert Pain management: pain level controlled Vital Signs Assessment: post-procedure vital signs reviewed and stable Respiratory status: spontaneous breathing, nonlabored ventilation, respiratory function stable and patient connected to nasal cannula oxygen Cardiovascular status: blood pressure returned to baseline and stable Postop Assessment: no signs of nausea or vomiting Anesthetic complications: no     Last Vitals:  Vitals:   09/03/16 1100 09/03/16 1110  BP: 135/82 136/87  Pulse: (!) 57 (!) 51  Resp: 17 11  Temp:      Last Pain:  Vitals:   09/03/16 1043  TempSrc: Tympanic                 Precious Haws Piscitello

## 2016-09-03 NOTE — Anesthesia Preprocedure Evaluation (Signed)
Anesthesia Evaluation  Patient identified by MRN, date of birth, ID band Patient awake    Reviewed: Allergy & Precautions, H&P , NPO status , Patient's Chart, lab work & pertinent test results, reviewed documented beta blocker date and time   Airway Mallampati: II   Neck ROM: full    Dental  (+) Poor Dentition, Teeth Intact   Pulmonary neg pulmonary ROS, former smoker,    Pulmonary exam normal        Cardiovascular negative cardio ROS Normal cardiovascular exam Rhythm:regular Rate:Normal     Neuro/Psych  Headaches, negative neurological ROS  negative psych ROS   GI/Hepatic negative GI ROS, Neg liver ROS, GERD  Medicated,  Endo/Other  negative endocrine ROS  Renal/GU negative Renal ROS  negative genitourinary   Musculoskeletal   Abdominal   Peds  Hematology negative hematology ROS (+)   Anesthesia Other Findings Past Medical History: No date: Allergy No date: Arthritis No date: Chicken pox No date: Migraines 2006: Substance abuse     Comment:  40 pk year tobacco  Past Surgical History: 1965: APPENDECTOMY 2012: EYE SURGERY; Bilateral     Comment:  2013 2011: HERNIA REPAIR; Right 2012: HERNIA REPAIR; Right     Comment:  redo of mesh , [place] 2013: HIP SURGERY; Right   Reproductive/Obstetrics negative OB ROS                             Anesthesia Physical Anesthesia Plan  ASA: III  Anesthesia Plan: General   Post-op Pain Management:    Induction:   PONV Risk Score and Plan:   Airway Management Planned:   Additional Equipment:   Intra-op Plan:   Post-operative Plan:   Informed Consent: I have reviewed the patients History and Physical, chart, labs and discussed the procedure including the risks, benefits and alternatives for the proposed anesthesia with the patient or authorized representative who has indicated his/her understanding and acceptance.   Dental Advisory  Given  Plan Discussed with: CRNA  Anesthesia Plan Comments:         Anesthesia Quick Evaluation

## 2016-09-03 NOTE — Anesthesia Procedure Notes (Signed)
Performed by: Candus Braud Pre-anesthesia Checklist: Patient identified, Emergency Drugs available, Suction available, Patient being monitored and Timeout performed Patient Re-evaluated:Patient Re-evaluated prior to induction Oxygen Delivery Method: Nasal cannula Preoxygenation: Pre-oxygenation with 100% oxygen Induction Type: IV induction       

## 2016-09-06 ENCOUNTER — Encounter: Payer: Self-pay | Admitting: Unknown Physician Specialty

## 2016-09-06 LAB — SURGICAL PATHOLOGY

## 2016-12-10 ENCOUNTER — Other Ambulatory Visit: Payer: Self-pay | Admitting: Internal Medicine

## 2016-12-10 DIAGNOSIS — M1990 Unspecified osteoarthritis, unspecified site: Secondary | ICD-10-CM

## 2016-12-10 NOTE — Telephone Encounter (Signed)
Please advise, last OV was 03/31/16, last refill was 03/31/16, #90 with 3 refills, thanks

## 2016-12-21 NOTE — Telephone Encounter (Signed)
Refill for 30 days only.  OFFICE VISIT NEEDED prior to any more refills   6 month follow up required for controlled substance refill s

## 2016-12-21 NOTE — Telephone Encounter (Signed)
Pt stated that his insurance will only cover one office visit a year so he scheduled an appt for his physical in February.   Rx has been printed, signed and faxed.

## 2017-04-01 ENCOUNTER — Ambulatory Visit (INDEPENDENT_AMBULATORY_CARE_PROVIDER_SITE_OTHER): Payer: PPO | Admitting: Internal Medicine

## 2017-04-01 ENCOUNTER — Encounter: Payer: Self-pay | Admitting: Internal Medicine

## 2017-04-01 VITALS — BP 122/70 | HR 66 | Temp 97.5°F | Resp 14 | Ht 69.0 in | Wt 164.2 lb

## 2017-04-01 DIAGNOSIS — E7800 Pure hypercholesterolemia, unspecified: Secondary | ICD-10-CM

## 2017-04-01 DIAGNOSIS — Z125 Encounter for screening for malignant neoplasm of prostate: Secondary | ICD-10-CM | POA: Diagnosis not present

## 2017-04-01 DIAGNOSIS — R5383 Other fatigue: Secondary | ICD-10-CM | POA: Diagnosis not present

## 2017-04-01 DIAGNOSIS — M1990 Unspecified osteoarthritis, unspecified site: Secondary | ICD-10-CM

## 2017-04-01 LAB — LIPID PANEL
CHOLESTEROL: 178 mg/dL (ref 0–200)
HDL: 45.7 mg/dL (ref >39.00)
LDL Cholesterol: 110 mg/dL — ABNORMAL HIGH (ref 0–99)
NONHDL: 132.17
Total CHOL/HDL Ratio: 4
Triglycerides: 113 mg/dL (ref 0.0–149.0)
VLDL: 22.6 mg/dL (ref 0.0–40.0)

## 2017-04-01 LAB — COMPREHENSIVE METABOLIC PANEL
ALK PHOS: 60 U/L (ref 39–117)
ALT: 14 U/L (ref 0–53)
AST: 13 U/L (ref 0–37)
Albumin: 4.2 g/dL (ref 3.5–5.2)
BUN: 20 mg/dL (ref 6–23)
CO2: 27 mEq/L (ref 19–32)
Calcium: 9.6 mg/dL (ref 8.4–10.5)
Chloride: 107 mEq/L (ref 96–112)
Creatinine, Ser: 0.86 mg/dL (ref 0.40–1.50)
GFR: 93.73 mL/min (ref >60.00)
GLUCOSE: 97 mg/dL (ref 70–99)
POTASSIUM: 4.5 meq/L (ref 3.5–5.1)
Sodium: 143 mEq/L (ref 135–145)
TOTAL PROTEIN: 6.8 g/dL (ref 6.0–8.3)
Total Bilirubin: 0.4 mg/dL (ref 0.2–1.2)

## 2017-04-01 LAB — CBC WITH DIFFERENTIAL/PLATELET
BASOS ABS: 0.1 10*3/uL (ref 0.0–0.1)
Basophils Relative: 0.7 % (ref 0.0–3.0)
EOS ABS: 0.4 10*3/uL (ref 0.0–0.7)
Eosinophils Relative: 4.7 % (ref 0.0–5.0)
HEMATOCRIT: 39.5 % (ref 39.0–52.0)
HEMOGLOBIN: 13.6 g/dL (ref 13.0–17.0)
LYMPHS PCT: 27.3 % (ref 12.0–46.0)
Lymphs Abs: 2.2 10*3/uL (ref 0.7–4.0)
MCHC: 34.4 g/dL (ref 30.0–36.0)
MCV: 91 fl (ref 78.0–100.0)
MONOS PCT: 10.3 % (ref 3.0–12.0)
Monocytes Absolute: 0.8 10*3/uL (ref 0.1–1.0)
Neutro Abs: 4.7 10*3/uL (ref 1.4–7.7)
Neutrophils Relative %: 57 % (ref 43.0–77.0)
Platelets: 413 10*3/uL — ABNORMAL HIGH (ref 150.0–400.0)
RBC: 4.34 Mil/uL (ref 4.22–5.81)
RDW: 13.6 % (ref 11.5–15.5)
WBC: 8.2 10*3/uL (ref 4.0–10.5)

## 2017-04-01 LAB — TSH: TSH: 1.41 u[IU]/mL (ref 0.35–4.50)

## 2017-04-01 LAB — PSA, MEDICARE: PSA: 3.84 ng/mL (ref 0.10–4.00)

## 2017-04-01 LAB — VITAMIN B12: Vitamin B-12: 974 pg/mL — ABNORMAL HIGH (ref 211–911)

## 2017-04-01 MED ORDER — TRAMADOL HCL 50 MG PO TABS: ORAL_TABLET | ORAL | 3 refills | Status: DC

## 2017-04-01 NOTE — Progress Notes (Signed)
Patient ID: Timothy Carrillo, male    DOB: December 15, 1950  Age: 67 y.o. MRN: [id number]  The patient is here for  management of other chronic and acute problems.  colonosocopy July one polyp hyperplastic   follow up ten years.  [place]" [place].  Severe cough  W.  ith pleurisy,  Fevers chills and rigors with productive cough  for 4 days  Still feels weak from it.  wife is now sick   No flu vaccine  Has not smoked since 2006 Moderate alcohol  Uses tylenol pm to sleep  Has occasional nocturia   Had a fall a month ago,  riggt hip acute on chronic pain rpemote back injruy  Small labral tear  Remote sciatica recurrent  Managed with tramadol  Occasional GERD Lactose intolerance   defers flu and shingles  UTD on TdaP    The risk factors are reflected in the social history. Lab Results  Component Value Date   PSA 3.84 04/01/2017   PSA 3.45 03/31/2016   PSA 3.03 03/13/2015     The roster of all physicians providing medical care to patient - is listed in the Snapshot section of the chart.  Activities of daily living:  The patient is 100% independent in all ADLs: dressing, toileting, feeding as well as independent mobility  Home safety : The patient has smoke detectors in the home. They wear seatbelts.  There are no firearms at home. There is no violence in the home.   There is no risks for hepatitis, STDs or HIV. There is no   history of blood transfusion. They have no travel history to infectious disease endemic areas of the world.  The patient has seen their dentist in the last six month. They have seen their eye doctor in the last year. They admit to slight hearing difficulty with regard to whispered voices and some television programs.  They have deferred audiologic testing in the last year.  They do not  have excessive sun exposure. Discussed the need for sun protection: hats, long sleeves and use of sunscreen if there is significant sun  exposure.   Diet: the importance of a healthy diet is discussed. They do have a healthy diet.  The benefits of regular aerobic exercise were discussed. She walks 4 times per week ,  20 minutes.   Depression screen: there are no signs or vegative symptoms of depression- irritability, change in appetite, anhedonia, sadness/tearfullness.  Cognitive assessment: the patient manages all their financial and personal affairs and is actively engaged. They could relate day,date,year and events; recalled 2/3 objects at 3 minutes; performed clock-face test normally.  The following portions of the patient's history were reviewed and updated as appropriate: allergies, current medications, past family history, past medical history,  past surgical history, past social history  and problem list.  Visual acuity was not assessed per patient preference since she has regular follow up with her ophthalmologist. Hearing and body mass index were assessed and reviewed.   During the course of the visit the patient was educated and counseled about appropriate screening and preventive services including : fall prevention , diabetes screening, nutrition counseling, colorectal cancer screening, and recommended immunizations.    CC: The primary encounter diagnosis was Fatigue, unspecified type. Diagnoses of Prostate cancer screening, Pure hypercholesterolemia, and Arthritis were also pertinent to this visit.  History Timothy Carrillo has a past medical history of Allergy, Arthritis, Chicken pox, Migraines, and Substance abuse (  [place]) (2006).   [name]; Eye surgery (Bilateral, 2012); Hip surgery (Right, 2013); Hernia repair (Right, 2011); Hernia repair (Right, 2012); [name], 09/03/2016).   His family history includes Cancer in his father; Stroke in his mother.He reports that he quit smoking about 12 years ago. His smoking use included cigarettes. he has  never used smokeless tobacco. He reports that he drinks about 5.0 oz of alcohol per week. He reports that he does not use drugs.  Outpatient Medications Prior to Visit  Medication Sig Dispense Refill  . naproxen sodium (ANAPROX) 220 MG tablet Take 220 mg by mouth every other day.     Marland Kitchen omeprazole (PRILOSEC) 20 MG capsule Take 20 mg by mouth daily as needed.    . traMADol (ULTRAM) 50 MG tablet take 1 tablet by mouth every 8 hours if needed 90 tablet 0   No facility-administered medications prior to visit.     Review of Systems  Patient denies headache, fevers, malaise, unintentional weight loss, skin rash, eye pain, sinus congestion and sinus pain, sore throat, dysphagia,  hemoptysis , cough, dyspnea, wheezing, chest pain, palpitations, orthopnea, edema, abdominal pain, nausea, melena, diarrhea, constipation, flank pain, dysuria, hematuria, urinary  Frequency, nocturia, numbness, tingling, seizures,  Focal weakness, Loss of consciousness,  Tremor, insomnia, depression, anxiety, and suicidal ideation.     Objective:  BP 122/70 (BP Location: Left Arm, Patient Position: Sitting, Cuff Size: Normal)   Pulse 66   Temp (!) 97.5 F (36.4 C) (Oral)   Resp 14   Ht 5\' 9"  (1.753 m)   Wt 164 lb 3.2 oz (74.5 kg)   SpO2 99%   BMI 24.25 kg/m   Physical Exam   General appearance: alert, cooperative and appears stated age Ears: normal TM's and external ear canals both ears Throat: lips, mucosa, and tongue normal; teeth and gums normal Neck: no adenopathy, no carotid bruit, supple, symmetrical, trachea midline and thyroid not enlarged, symmetric, no tenderness/mass/nodules Back: symmetric, no curvature. ROM normal. No CVA tenderness. Lungs: clear to auscultation bilaterally Heart: regular rate and rhythm, S1, S2 normal, no murmur, click, rub or gallop Abdomen: soft, non-tender; bowel sounds normal; no masses,  no organomegaly Pulses: 2+ and symmetric Skin: Skin color, texture, turgor normal. No  rashes or lesions Lymph nodes: Cervical, supraclavicular, and axillary nodes normal.    Assessment & Plan:   Problem List Items Addressed This Visit    Arthritis   Relevant Medications   traMADol (ULTRAM) 50 MG tablet   Prostate cancer screening    PSA is normal but has been rising since 2014   BPH suspected based on report of nocturia.   Lab Results  Component Value Date   PSA 3.84 04/01/2017   PSA 3.45 03/31/2016   PSA 3.03 03/13/2015         Relevant Orders   PSA, Medicare (Completed)   Fatigue - Primary   Relevant Orders   Comprehensive metabolic panel (Completed)   TSH (Completed)   Vitamin B12 (Completed)   CBC with Differential/Platelet (Completed)   Hyperlipidemia    based on current lipid profile, the risk of clinically significant Coronary artery disease is17% over the next 10 years, using the Framingham risk calculator.  Statin recommended.  Lab Results  Component Value Date   CHOL 178 04/01/2017   HDL 45.70 04/01/2017   LDLCALC 110 (H) 04/01/2017   LDLDIRECT 102.0 03/31/2016   TRIG 113.0 04/01/2017   CHOLHDL  4 04/01/2017         Relevant Orders   Lipid panel (Completed)     A total of 40 minutes was spent with patient more than half of which was spent in counseling patient on the above mentioned issues , reviewing and explaining recent labs and imaging studies done, and coordination of care.   I am having Audelia Acton maintain his naproxen sodium, omeprazole, and traMADol.  Meds ordered this encounter  Medications  . traMADol (ULTRAM) 50 MG tablet    Sig: take 1 tablet by mouth every 8 hours if needed    Dispense:  90 tablet    Refill:  3    Refill for 30 days only.  OFFICE VISIT NEEDED prior to any more refills    Medications Discontinued During This Encounter  Medication Reason  . traMADol (ULTRAM) 50 MG tablet Reorder    Follow-up: Return in about 1 year (around 04/01/2018).   Crecencio Mc, MD

## 2017-04-01 NOTE — Patient Instructions (Signed)

## 2017-04-02 ENCOUNTER — Encounter: Payer: Self-pay | Admitting: Internal Medicine

## 2017-04-02 NOTE — Assessment & Plan Note (Signed)
based on current lipid profile, the risk of clinically significant Coronary artery disease is17% over the next 10 years, using the Framingham risk calculator.  Statin recommended.  Lab Results  Component Value Date   CHOL 178 04/01/2017   HDL 45.70 04/01/2017   LDLCALC 110 (H) 04/01/2017   LDLDIRECT 102.0 03/31/2016   TRIG 113.0 04/01/2017   CHOLHDL 4 04/01/2017

## 2017-04-02 NOTE — Assessment & Plan Note (Signed)
PSA is normal but has been rising since 2014   BPH suspected based on report of nocturia.   Lab Results  Component Value Date   PSA 3.84 04/01/2017   PSA 3.45 03/31/2016   PSA 3.03 03/13/2015

## 2017-10-25 ENCOUNTER — Other Ambulatory Visit: Payer: Self-pay | Admitting: Internal Medicine

## 2017-10-25 DIAGNOSIS — M1990 Unspecified osteoarthritis, unspecified site: Secondary | ICD-10-CM

## 2017-10-25 NOTE — Telephone Encounter (Signed)
Refilled: 04/01/2017 Last OV: 04/01/2017 Next OV: not scheduled

## 2017-10-26 ENCOUNTER — Telehealth: Payer: Self-pay | Admitting: Internal Medicine

## 2017-10-26 NOTE — Telephone Encounter (Signed)
Tramadol refill refused bc it is a controlled substance and he has not been seen for 6 month follow up

## 2018-04-12 ENCOUNTER — Encounter: Payer: Self-pay | Admitting: Internal Medicine

## 2018-04-12 ENCOUNTER — Ambulatory Visit (INDEPENDENT_AMBULATORY_CARE_PROVIDER_SITE_OTHER): Payer: PPO | Admitting: Internal Medicine

## 2018-04-12 VITALS — BP 142/84 | HR 78 | Temp 98.0°F | Resp 14 | Ht 69.0 in | Wt 169.8 lb

## 2018-04-12 DIAGNOSIS — E7800 Pure hypercholesterolemia, unspecified: Secondary | ICD-10-CM

## 2018-04-12 DIAGNOSIS — M1990 Unspecified osteoarthritis, unspecified site: Secondary | ICD-10-CM

## 2018-04-12 DIAGNOSIS — K2100 Gastro-esophageal reflux disease with esophagitis, without bleeding: Secondary | ICD-10-CM

## 2018-04-12 DIAGNOSIS — E785 Hyperlipidemia, unspecified: Secondary | ICD-10-CM

## 2018-04-12 DIAGNOSIS — Z125 Encounter for screening for malignant neoplasm of prostate: Secondary | ICD-10-CM | POA: Diagnosis not present

## 2018-04-12 DIAGNOSIS — R030 Elevated blood-pressure reading, without diagnosis of hypertension: Secondary | ICD-10-CM | POA: Diagnosis not present

## 2018-04-12 LAB — LIPID PANEL
CHOLESTEROL: 185 mg/dL (ref 0–200)
HDL: 57.8 mg/dL (ref >39.00)
LDL Cholesterol: 101 mg/dL — ABNORMAL HIGH (ref 0–99)
NonHDL: 126.82
TRIGLYCERIDES: 127 mg/dL (ref 0.0–149.0)
Total CHOL/HDL Ratio: 3
VLDL: 25.4 mg/dL (ref 0.0–40.0)

## 2018-04-12 LAB — MICROALBUMIN / CREATININE URINE RATIO
Creatinine,U: 73.1 mg/dL
MICROALB/CREAT RATIO: 1 mg/g (ref 0.0–30.0)

## 2018-04-12 LAB — COMPREHENSIVE METABOLIC PANEL
ALT: 11 U/L (ref 0–53)
AST: 14 U/L (ref 0–37)
Albumin: 4.5 g/dL (ref 3.5–5.2)
Alkaline Phosphatase: 69 U/L (ref 39–117)
BUN: 16 mg/dL (ref 6–23)
CO2: 27 mEq/L (ref 19–32)
Calcium: 9.4 mg/dL (ref 8.4–10.5)
Chloride: 100 mEq/L (ref 96–112)
Creatinine, Ser: 0.96 mg/dL (ref 0.40–1.50)
GFR: 77.44 mL/min (ref >60.00)
Glucose, Bld: 77 mg/dL (ref 70–99)
POTASSIUM: 4.6 meq/L (ref 3.5–5.1)
Sodium: 136 mEq/L (ref 135–145)
Total Bilirubin: 0.5 mg/dL (ref 0.2–1.2)
Total Protein: 7.1 g/dL (ref 6.0–8.3)

## 2018-04-12 LAB — TSH: TSH: 2.35 u[IU]/mL (ref 0.35–4.50)

## 2018-04-12 LAB — PSA, MEDICARE: PSA: 3.06 ng/ml (ref 0.10–4.00)

## 2018-04-12 MED ORDER — SILDENAFIL CITRATE 20 MG PO TABS: 20.0000 mg | ORAL_TABLET | Freq: Three times a day (TID) | ORAL | 3 refills | Status: DC

## 2018-04-12 MED ORDER — TRAMADOL HCL 50 MG PO TABS: ORAL_TABLET | ORAL | 3 refills | Status: DC

## 2018-04-12 NOTE — Patient Instructions (Addendum)
I have refilled your tramadol and generic Viagra  Please reconsider your deferral of the flu and Shingles vaccines!   The new "normal"  optimal blood pressure is 120/70.  Please check your blood pressure a few tmes at home and send me the readings so I can determine if you need to start medication    Managing Your Hypertension Hypertension is commonly called high blood pressure. This is when the force of your blood pressing against the walls of your arteries is too strong. Arteries are blood vessels that carry blood from your heart throughout your body. Hypertension forces the heart to work harder to pump blood, and may cause the arteries to become narrow or stiff. Having untreated or uncontrolled hypertension can cause heart attack, stroke, kidney disease, and other problems. What are blood pressure readings? A blood pressure reading consists of a higher number over a lower number. Ideally, your blood pressure should be below 120/80. The first ("top") number is called the systolic pressure. It is a measure of the pressure in your arteries as your heart beats. The second ("bottom") number is called the diastolic pressure. It is a measure of the pressure in your arteries as the heart relaxes. What does my blood pressure reading mean? Blood pressure is classified into four stages. Based on your blood pressure reading, your health care provider may use the following stages to determine what type of treatment you need, if any. Systolic pressure and diastolic pressure are measured in a unit called mm Hg. Normal  Systolic pressure: below 283.  Diastolic pressure: below 80. Elevated  Systolic pressure: 662-947.  Diastolic pressure: below 80. Hypertension stage 1  Systolic pressure: 654-650.  Diastolic pressure: 35-46. Hypertension stage 2  Systolic pressure: 568 or above.  Diastolic pressure: 90 or above. What health risks are associated with hypertension? Managing your hypertension is an  important responsibility. Uncontrolled hypertension can lead to:  A heart attack.  A stroke.  A weakened blood vessel (aneurysm).  Heart failure.  Kidney damage.  Eye damage.  Metabolic syndrome.  Memory and concentration problems. What changes can I make to manage my hypertension? Hypertension can be managed by making lifestyle changes and possibly by taking medicines. Your health care provider will help you make a plan to bring your blood pressure within a normal range. Eating and drinking   Eat a diet that is high in fiber and potassium, and low in salt (sodium), added sugar, and fat. An example eating plan is called the DASH (Dietary Approaches to Stop Hypertension) diet. To eat this way: ? Eat plenty of fresh fruits and vegetables. Try to fill half of your plate at each meal with fruits and vegetables. ? Eat whole grains, such as whole wheat pasta, brown rice, or whole grain bread. Fill about one quarter of your plate with whole grains. ? Eat low-fat diary products. ? Avoid fatty cuts of meat, processed or cured meats, and poultry with skin. Fill about one quarter of your plate with lean proteins such as fish, chicken without skin, beans, eggs, and tofu. ? Avoid premade and processed foods. These tend to be higher in sodium, added sugar, and fat.  Reduce your daily sodium intake. Most people with hypertension should eat less than 1,500 mg of sodium a day.  Limit alcohol intake to no more than 1 drink a day for nonpregnant women and 2 drinks a day for men. One drink equals 12 oz of beer, 5 oz of wine, or 1 oz of hard  liquor. Lifestyle  Work with your health care provider to maintain a healthy body weight, or to lose weight. Ask what an ideal weight is for you.  Get at least 30 minutes of exercise that causes your heart to beat faster (aerobic exercise) most days of the week. Activities may include walking, swimming, or biking.  Include exercise to strengthen your muscles  (resistance exercise), such as weight lifting, as part of your weekly exercise routine. Try to do these types of exercises for 30 minutes at least 3 days a week.  Do not use any products that contain nicotine or tobacco, such as cigarettes and e-cigarettes. If you need help quitting, ask your health care provider.  Control any long-term (chronic) conditions you have, such as high cholesterol or diabetes. Monitoring  Monitor your blood pressure at home as told by your health care provider. Your personal target blood pressure may vary depending on your medical conditions, your age, and other factors.  Have your blood pressure checked regularly, as often as told by your health care provider. Working with your health care provider  Review all the medicines you take with your health care provider because there may be side effects or interactions.  Talk with your health care provider about your diet, exercise habits, and other lifestyle factors that may be contributing to hypertension.  Visit your health care provider regularly. Your health care provider can help you create and adjust your plan for managing hypertension. Will I need medicine to control my blood pressure? Your health care provider may prescribe medicine if lifestyle changes are not enough to get your blood pressure under control, and if:  Your systolic blood pressure is 130 or higher.  Your diastolic blood pressure is 80 or higher. Take medicines only as told by your health care provider. Follow the directions carefully. Blood pressure medicines must be taken as prescribed. The medicine does not work as well when you skip doses. Skipping doses also puts you at risk for problems. Contact a health care provider if:  You think you are having a reaction to medicines you have taken.  You have repeated (recurrent) headaches.  You feel dizzy.  You have swelling in your ankles.  You have trouble with your vision. Get help right  away if:  You develop a severe headache or confusion.  You have unusual weakness or numbness, or you feel faint.  You have severe pain in your chest or abdomen.  You vomit repeatedly.  You have trouble breathing. Summary  Hypertension is when the force of blood pumping through your arteries is too strong. If this condition is not controlled, it may put you at risk for serious complications.  Your personal target blood pressure may vary depending on your medical conditions, your age, and other factors. For most people, a normal blood pressure is less than 120/80.  Hypertension is managed by lifestyle changes, medicines, or both. Lifestyle changes include weight loss, eating a healthy, low-sodium diet, exercising more, and limiting alcohol. This information is not intended to replace advice given to you by your health care provider. Make sure you discuss any questions you have with your health care provider. Document Released: 10/20/2011 Document Revised: 12/24/2015 Document Reviewed: 12/24/2015 Elsevier Interactive Patient Education  2019 [employer].

## 2018-04-12 NOTE — Progress Notes (Signed)
Patient ID: Timothy Carrillo, male    DOB: 1951-01-26  Age: 68 y.o. MRN:   The patient is here for follow up and management of other chronic and acute problems including  reflux esophagitis managed with prn omeprazole,  BPH with nocturia, untreated hyperlipidemia and chronic joint pain secondary to DJD of spine and labral tear of right hip  .   The risk factors are reflected in the social history.  The roster of all physicians providing medical care to patient - is listed in the Snapshot section of the chart.  Activities of daily living:  The patient is 100% independent in all ADLs: dressing, toileting, feeding as well as independent mobility  Home safety : The patient has smoke detectors in the home. They wear seatbelts.  There are no firearms at home. There is no violence in the home.   There is no risks for hepatitis, STDs or HIV. There is no   history of blood transfusion. They have no travel history to infectious disease endemic areas of the world.  The patient has seen their dentist in the last six month. They have seen their eye doctor in the last year. They admit to slight hearing difficulty with regard to whispered voices and some television programs.  They have deferred audiologic testing in the last year.  They do not  have excessive sun exposure. Discussed the need for sun protection: hats, long sleeves and use of sunscreen if there is significant sun exposure.   Diet: the importance of a healthy diet is discussed. They do have a healthy diet.  The benefits of regular aerobic exercise were discussed. She walks 4 times per week,  20 minutes.   Depression screen: there are no signs or vegative symptoms of depression- irritability, change in appetite, anhedonia, sadness/tearfullness.  Cognitive assessment: the patient manages all their financial and personal affairs and is actively engaged. They could relate day,date,year and events; recalled 2/3 objects at 3 minutes; performed  clock-face test normally.  The following portions of the patient's history were reviewed and updated as appropriate: allergies, current medications, past family history, past medical history,  past surgical history, past social history  and problem list.  Visual acuity was not assessed per patient preference since she has regular follow up with her ophthalmologist. Hearing and body mass index were assessed and reviewed.   During the course of the visit the patient was educated and counseled about appropriate screening and preventive services including : fall prevention , diabetes screening, nutrition counseling, colorectal cancer screening, and recommended immunizations.    CC: The primary encounter diagnosis was Reflux esophagitis. Diagnoses of Hyperlipidemia LDL goal <100, Prostate cancer screening, Arthritis, Elevated blood pressure reading without diagnosis of hypertension, Elevated blood pressure reading in office without diagnosis of hypertension, and Pure hypercholesterolemia were also pertinent to this visit.  History Milos has a past medical history of Allergy, Arthritis, Chicken pox, Migraines, and Substance abuse ([place]) (2006).   [name]; Hip surgery (Right, 2013); Hernia repair (Right, 2011); Hernia repair (Right, 2012); Colonoscopy with propofol (N/A, 09/03/2016); and Cataract extraction, bilateral (Bilateral, 2012).   His family history includes Cancer in his father; Stroke in his mother.He reports that he quit smoking about 13 years ago. His smoking use included cigarettes. He has never used smokeless tobacco. He reports current alcohol use of about 10.0 standard drinks of alcohol per week. He reports that he does not use drugs.  Outpatient Medications Prior  to Visit  Medication Sig Dispense Refill  . omeprazole (PRILOSEC) 20 MG capsule Take 20 mg by mouth daily as needed.    . traMADol (ULTRAM) 50 MG tablet take 1 tablet by  mouth every 8 hours if needed 90 tablet 3  . naproxen sodium (ANAPROX) 220 MG tablet Take 220 mg by mouth every other day.      No facility-administered medications prior to visit.     Review of Systems   Patient denies headache, fevers, malaise, unintentional weight loss, skin rash, eye pain, sinus congestion and sinus pain, sore throat, dysphagia,  hemoptysis , cough, dyspnea, wheezing, chest pain, palpitations, orthopnea, edema, abdominal pain, nausea, melena, diarrhea, constipation, flank pain, dysuria, hematuria, urinary  Frequency, nocturia, numbness, tingling, seizures,  Focal weakness, Loss of consciousness,  Tremor, insomnia, depression, anxiety, and suicidal ideation.     Objective:  BP (!) 142/84 (BP Location: Right Arm, Patient Position: Sitting, Cuff Size: Normal)   Pulse 78   Temp 98 F (36.7 C) (Oral)   Resp 14   Ht 5\' 9"  (1.753 m)   Wt 169 lb 12.8 oz (77 kg)   SpO2 97%   BMI 25.08 kg/m   Physical Exam   General appearance: alert, cooperative and appears stated age Ears: normal TM's and external ear canals both ears Throat: lips, mucosa, and tongue normal; teeth and gums normal Neck: no adenopathy, no carotid bruit, supple, symmetrical, trachea midline and thyroid not enlarged, symmetric, no tenderness/mass/nodules Back: symmetric, no curvature. ROM normal. No CVA tenderness. Lungs: clear to auscultation bilaterally Heart: regular rate and rhythm, S1, S2 normal, no murmur, click, rub or gallop Abdomen: soft, non-tender; bowel sounds normal; no masses,  no organomegaly Pulses: 2+ and symmetric Skin: Skin color, texture, turgor normal. No rashes or lesions Lymph nodes: Cervical, supraclavicular, and axillary nodes normal.    Assessment & Plan:   Problem List Items Addressed This Visit    Reflux esophagitis - Primary   Arthritis   Relevant Medications   traMADol (ULTRAM) 50 MG tablet   Prostate cancer screening   Relevant Orders   PSA, Medicare (Completed)    Hyperlipidemia    based on current lipid profile, the risk of clinically significant Coronary artery disease is 20 % over the next 10 years, using the Framingham risk calculator.  Statin again  Recommended but deferred by patient.  He plans to use [employer] Rice   Lab Results  Component Value Date   CHOL 185 04/12/2018   HDL 57.80 04/12/2018   LDLCALC 101 (H) 04/12/2018   LDLDIRECT 102.0 03/31/2016   TRIG 127.0 04/12/2018   CHOLHDL 3 04/12/2018         Relevant Medications   sildenafil (REVATIO) 20 MG tablet   Elevated blood pressure reading in office without diagnosis of hypertension    He has no prior history of hypertension. He will check his blood pressure several times over the next 3-4 weeks and to submit readings for evaluation. Urine microalbumin to creatinine ratio done today is normal.  Lab Results  Component Value Date   MICROALBUR <0.7 04/12/2018   Lab Results  Component Value Date   CREATININE 0.96 04/12/2018   Lab Results  Component Value Date   NA 136 04/12/2018   K 4.6 04/12/2018   CL 100 04/12/2018   CO2 27 04/12/2018          Other Visit Diagnoses    Hyperlipidemia LDL goal <100       Relevant Medications  sildenafil (REVATIO) 20 MG tablet   Other Relevant Orders   Lipid panel (Completed)   Comprehensive metabolic panel (Completed)   TSH (Completed)   Elevated blood pressure reading without diagnosis of hypertension       Relevant Orders   Microalbumin / creatinine urine ratio (Completed)     A total of 40 minutes was spent with patient more than half of which was spent in counseling patient on the above mentioned issues , reviewing and explaining recent labs and imaging studies done, and coordination of care.   I am having Audelia Acton start on sildenafil. I am also having him maintain his naproxen sodium, omeprazole, and traMADol.  Meds ordered this encounter  Medications  . sildenafil (REVATIO) 20 MG tablet    Sig: Take 1  tablet (20 mg total) by mouth 3 (three) times daily.    Dispense:  90 tablet    Refill:  3  . DISCONTD: traMADol (ULTRAM) 50 MG tablet    Sig: take 1 tablet by mouth every 8 hours if needed    Dispense:  90 tablet    Refill:  3  . traMADol (ULTRAM) 50 MG tablet    Sig: take 1 tablet by mouth every 8 hours if needed    Dispense:  90 tablet    Refill:  3    Medications Discontinued During This Encounter  Medication Reason  . traMADol (ULTRAM) 50 MG tablet Reorder  . traMADol (ULTRAM) 50 MG tablet Reorder    Follow-up: No follow-ups on file.   Crecencio Mc, MD

## 2018-04-13 ENCOUNTER — Telehealth: Payer: Self-pay | Admitting: Internal Medicine

## 2018-04-13 NOTE — Telephone Encounter (Signed)
Copied from [phone]. Topic: Quick Communication - Rx Refill/Question >> Apr 13, 2018  3:52 PM Sheppard Coil, [employer] wrote: Medication: sildenafil (REVATIO) 20 MG tablet  Pt called and states that pharmacy stated they needed to know why this was being filled before they would  give medication to pt. States that PCP needs to call pharmacy and tell them it is for hypertension.

## 2018-04-13 NOTE — Assessment & Plan Note (Signed)
He has no prior history of hypertension. He will check his blood pressure several times over the next 3-4 weeks and to submit readings for evaluation. Urine microalbumin to creatinine ratio done today is normal.  Lab Results  Component Value Date   MICROALBUR <0.7 04/12/2018   Lab Results  Component Value Date   CREATININE 0.96 04/12/2018   Lab Results  Component Value Date   NA 136 04/12/2018   K 4.6 04/12/2018   CL 100 04/12/2018   CO2 27 04/12/2018

## 2018-04-13 NOTE — Assessment & Plan Note (Signed)
based on current lipid profile, the risk of clinically significant Coronary artery disease is 20 % over the next 10 years, using the Framingham risk calculator.  Statin again  Recommended but deferred by patient.  He plans to use Red Yeast Rice   Lab Results  Component Value Date   CHOL 185 04/12/2018   HDL 57.80 04/12/2018   LDLCALC 101 (H) 04/12/2018   LDLDIRECT 102.0 03/31/2016   TRIG 127.0 04/12/2018   CHOLHDL 3 04/12/2018

## 2018-04-18 NOTE — Telephone Encounter (Signed)
Spoke with pt and explained to him that this medication is not for hypertension and that the pharmacy stated that he is able to get it filled. Pt gave a verbal understanding.

## 2018-09-08 ENCOUNTER — Other Ambulatory Visit: Payer: Self-pay

## 2019-01-03 ENCOUNTER — Other Ambulatory Visit: Payer: Self-pay

## 2019-04-16 ENCOUNTER — Telehealth: Payer: Self-pay | Admitting: Internal Medicine

## 2019-04-16 ENCOUNTER — Encounter: Payer: Self-pay | Admitting: Internal Medicine

## 2019-04-16 ENCOUNTER — Ambulatory Visit (INDEPENDENT_AMBULATORY_CARE_PROVIDER_SITE_OTHER): Payer: PPO | Admitting: Internal Medicine

## 2019-04-16 ENCOUNTER — Other Ambulatory Visit: Payer: Self-pay

## 2019-04-16 VITALS — BP 152/98 | HR 65 | Temp 97.5°F | Resp 15 | Ht 69.0 in | Wt 169.8 lb

## 2019-04-16 DIAGNOSIS — Z0001 Encounter for general adult medical examination with abnormal findings: Secondary | ICD-10-CM

## 2019-04-16 DIAGNOSIS — N529 Male erectile dysfunction, unspecified: Secondary | ICD-10-CM | POA: Diagnosis not present

## 2019-04-16 DIAGNOSIS — E7800 Pure hypercholesterolemia, unspecified: Secondary | ICD-10-CM | POA: Diagnosis not present

## 2019-04-16 DIAGNOSIS — Z86010 Personal history of colonic polyps: Secondary | ICD-10-CM

## 2019-04-16 DIAGNOSIS — R351 Nocturia: Secondary | ICD-10-CM

## 2019-04-16 DIAGNOSIS — Z125 Encounter for screening for malignant neoplasm of prostate: Secondary | ICD-10-CM | POA: Diagnosis not present

## 2019-04-16 DIAGNOSIS — R030 Elevated blood-pressure reading, without diagnosis of hypertension: Secondary | ICD-10-CM

## 2019-04-16 DIAGNOSIS — N401 Enlarged prostate with lower urinary tract symptoms: Secondary | ICD-10-CM | POA: Diagnosis not present

## 2019-04-16 DIAGNOSIS — I10 Essential (primary) hypertension: Secondary | ICD-10-CM | POA: Diagnosis not present

## 2019-04-16 DIAGNOSIS — M25432 Effusion, left wrist: Secondary | ICD-10-CM | POA: Diagnosis not present

## 2019-04-16 LAB — LIPID PANEL
Cholesterol: 197 mg/dL (ref 0–200)
HDL: 59.9 mg/dL (ref >39.00)
LDL Cholesterol: 118 mg/dL — ABNORMAL HIGH (ref 0–99)
NonHDL: 137.12
Total CHOL/HDL Ratio: 3
Triglycerides: 97 mg/dL (ref 0.0–149.0)
VLDL: 19.4 mg/dL (ref 0.0–40.0)

## 2019-04-16 LAB — COMPREHENSIVE METABOLIC PANEL
ALT: 11 U/L (ref 0–53)
AST: 14 U/L (ref 0–37)
Albumin: 4.2 g/dL (ref 3.5–5.2)
Alkaline Phosphatase: 60 U/L (ref 39–117)
BUN: 15 mg/dL (ref 6–23)
CO2: 28 mEq/L (ref 19–32)
Calcium: 9.3 mg/dL (ref 8.4–10.5)
Chloride: 104 mEq/L (ref 96–112)
Creatinine, Ser: 0.86 mg/dL (ref 0.40–1.50)
GFR: 87.67 mL/min (ref >60.00)
Glucose, Bld: 95 mg/dL (ref 70–99)
Potassium: 4.4 mEq/L (ref 3.5–5.1)
Sodium: 138 mEq/L (ref 135–145)
Total Bilirubin: 0.5 mg/dL (ref 0.2–1.2)
Total Protein: 6.9 g/dL (ref 6.0–8.3)

## 2019-04-16 LAB — MICROALBUMIN / CREATININE URINE RATIO
Creatinine,U: 215.5 mg/dL
Microalb Creat Ratio: 0.6 mg/g (ref 0.0–30.0)
Microalb, Ur: 1.3 mg/dL (ref 0.0–1.9)

## 2019-04-16 LAB — TSH: TSH: 1.96 u[IU]/mL (ref 0.35–4.50)

## 2019-04-16 LAB — PSA, MEDICARE: PSA: 2.65 ng/ml (ref 0.10–4.00)

## 2019-04-16 MED ORDER — AMLODIPINE BESYLATE 2.5 MG PO TABS: 2.5000 mg | ORAL_TABLET | Freq: Every day | ORAL | 3 refills | Status: DC

## 2019-04-16 MED ORDER — SILDENAFIL CITRATE 20 MG PO TABS: 20.0000 mg | ORAL_TABLET | Freq: Three times a day (TID) | ORAL | 5 refills | Status: DC

## 2019-04-16 NOTE — Assessment & Plan Note (Signed)
Recommended RYR 600 mg bid and repeat in 3 months

## 2019-04-16 NOTE — Progress Notes (Signed)
Patient ID: Timothy Carrillo, male    DOB: Jun 08, 1950  Age: 69 y.o. MRN: [id number]  The patient is here for annual preventive  examination and management of other chronic and acute problems.  This visit occurred during the SARS-CoV-2 public health emergency.  Safety protocols were in place, including screening questions prior to the visit, additional usage of staff PPE, and extensive cleaning of exam room while observing appropriate contact time as indicated for disinfecting solutions.     The risk factors are reflected in the social history.  The roster of all physicians providing medical care to patient - is listed in the Snapshot section of the chart.  Activities of daily living:  The patient is 100% independent in all ADLs: dressing, toileting, feeding as well as independent mobility  Home safety : The patient has smoke detectors in the home. They wear seatbelts.  There are no firearms at home. There is no violence in the home.   There is no risks for hepatitis, STDs or HIV. There is no   history of blood transfusion. They have no travel history to infectious disease endemic areas of the world.  The patient has seen their dentist in the last six month. They have seen their eye doctor in the last year. He denies  hearing difficulty with regard to whispered voices and some television programs.  They have deferred audiologic testing in the last year.  They do not  have excessive sun exposure. Discussed the need for sun protection: hats, long sleeves and use of sunscreen if there is significant sun exposure.   Diet: the importance of a healthy diet is discussed. They do have a healthy diet.  The benefits of regular aerobic exercise were discussed. He golfs 3  times per week ,  120 minutes.   Depression screen: there are no signs or vegative symptoms of depression- irritability, change in appetite, anhedonia, sadness/tearfullness.  Cognitive assessment: the patient manages all their  financial and personal affairs and is actively engaged. They could relate day,date,year and events; recalled 2/3 objects at 3 minutes; performed clock-face test normally.  The following portions of the patient's history were reviewed and updated as appropriate: allergies, current medications, past family history, past medical history,  past surgical history, past social history  and problem list.  Visual acuity was not assessed per patient preference since she has regular follow up with her ophthalmologist. Hearing and body mass index were assessed and reviewed.   During the course of the visit the patient was educated and counseled about appropriate screening and preventive services including : fall prevention , diabetes screening, nutrition counseling, colorectal cancer screening, and recommended immunizations.    CC: The primary encounter diagnosis was BPH associated with nocturia. Diagnoses of Pure hypercholesterolemia, Prostate cancer screening, Elevated blood pressure reading in office without diagnosis of hypertension, Essential hypertension, Erectile dysfunction, unspecified erectile dysfunction type, Personal history of colonic polyps, Encounter for general adult medical examination with abnormal findings, and Wrist swelling, left were also pertinent to this visit.   1) follow up on hyperlipidemia,  Untreated risk using FRC was 20% last year . Still untreated  2) elevated BP w/o diagnosis of HTN : home readings   3) taking aleve PM for insomnia and saw palmetto for nocturia x 5,  Now improved   4) cystic swelling of left wrist, medial side , interferes with motion of wrist during golf swing  5) ED: wants rx for viagra   History Sahas has a past medical  history of Allergy, Arthritis, Chicken pox, Migraines, and Substance abuse ([place]) (2006).   [name]; Hip surgery (Right, 2013); Hernia repair (Right, 2011); Hernia repair  (Right, 2012); Colonoscopy with propofol (N/A, 09/03/2016); and Cataract extraction, bilateral (Bilateral, 2012).   His family history includes Cancer in his father; Stroke in his mother.He reports that he quit smoking about 14 years ago. His smoking use included cigarettes. He has never used smokeless tobacco. He reports current alcohol use of about 10.0 standard drinks of alcohol per week. He reports that he does not use drugs.  Outpatient Medications Prior to Visit  Medication Sig Dispense Refill  . omeprazole (PRILOSEC) 20 MG capsule Take 20 mg by mouth daily as needed.    . Saw Palmetto 450 MG CAPS Take 1 capsule by mouth daily.    . traMADol (ULTRAM) 50 MG tablet take 1 tablet by mouth every 8 hours if needed 90 tablet 3  . Turmeric 500 MG CAPS Take 1 capsule by mouth daily.    . naproxen sodium (ANAPROX) 220 MG tablet Take 220 mg by mouth every other day.     . sildenafil (REVATIO) 20 MG tablet Take 1 tablet (20 mg total) by mouth 3 (three) times daily. (Patient not taking: Reported on 04/16/2019) 90 tablet 3   No facility-administered medications prior to visit.    Review of Systems   Patient denies headache, fevers, malaise, unintentional weight loss, skin rash, eye pain, sinus congestion and sinus pain, sore throat, dysphagia,  hemoptysis , cough, dyspnea, wheezing, chest pain, palpitations, orthopnea, edema, abdominal pain, nausea, melena, diarrhea, constipation, flank pain, dysuria, hematuria, urinary  Frequency, nocturia, numbness, tingling, seizures,  Focal weakness, Loss of consciousness,  Tremor, insomnia, depression, anxiety, and suicidal ideation.      Objective:  BP (!) 152/98 (BP Location: Left Arm, Patient Position: Sitting, Cuff Size: Normal)   Pulse 65   Temp (!) 97.5 F (36.4 C) (Temporal)   Resp 15   Ht 5\' 9"  (1.753 m)   Wt 169 lb 12.8 oz (77 kg)   SpO2 96%   BMI 25.08 kg/m   Physical Exam   General appearance: alert, cooperative and appears stated age Ears:  normal TM's and external ear canals both ears Throat: lips, mucosa, and tongue normal; teeth and gums normal Neck: no adenopathy, no carotid bruit, supple, symmetrical, trachea midline and thyroid not enlarged, symmetric, no tenderness/mass/nodules Back: symmetric, no curvature. ROM normal. No CVA tenderness. Lungs: clear to auscultation bilaterally Heart: regular rate and rhythm, S1, S2 normal, no murmur, click, rub or gallop Abdomen: soft, non-tender; bowel sounds normal; no masses,  no organomegaly Pulses: 2+ and symmetric MSK: left medial wrist, volar side, with cystic swelling.  Skin: Skin color, texture, turgor normal. No rashes or lesions Lymph nodes: Cervical, supraclavicular, and axillary nodes normal.    Assessment & Plan:   Problem List Items Addressed This Visit      Unprioritized   Prostate cancer screening   Relevant Orders   PSA, Medicare (Completed)   Personal history of colonic polyps    Last colonoscopy was [id number]; sessile polyp removed .  5 yr follow up       Encounter for general adult medical examination with abnormal findings    Left wrist swelling noted with decreased ROM  orthopedocs referral advised      BPH associated with nocturia - Primary   Essential hypertension    Starting amlodipine 2.5 mg daily qhs .  Relevant Medications   amLODipine (NORVASC) 2.5 MG tablet   sildenafil (REVATIO) 20 MG tablet   Hyperlipidemia    Recommended RYR 600 mg bid and repeat in 3 months       Relevant Medications   amLODipine (NORVASC) 2.5 MG tablet   sildenafil (REVATIO) 20 MG tablet   Other Relevant Orders   Lipid panel (Completed)   TSH (Completed)   Erectile dysfunction    Sildenafil 20 mg  #90 prescribed. Risks and benefits explained        Other Visit Diagnoses    Wrist swelling, left       Relevant Orders   Ambulatory referral to Orthopedic Surgery      I have discontinued Lieutenant E. Gasiorowski's naproxen sodium and sildenafil. I am also  having him start on amLODipine and sildenafil. Additionally, I am having him maintain his omeprazole, traMADol, Saw Palmetto, and Turmeric.  Meds ordered this encounter  Medications  . amLODipine (NORVASC) 2.5 MG tablet    Sig: Take 1 tablet (2.5 mg total) by mouth at bedtime.    Dispense:  90 tablet    Refill:  3  . sildenafil (REVATIO) 20 MG tablet    Sig: Take 1 tablet (20 mg total) by mouth 3 (three) times daily.    Dispense:  90 tablet    Refill:  5    Medications Discontinued During This Encounter  Medication Reason  . naproxen sodium (ANAPROX) 220 MG tablet Patient Preference  . sildenafil (REVATIO) 20 MG tablet Prescription never filled    Follow-up: No follow-ups on file.   Crecencio Mc, MD

## 2019-04-16 NOTE — Assessment & Plan Note (Signed)
Left wrist swelling noted with decreased ROM  orthopedocs referral advised

## 2019-04-16 NOTE — Assessment & Plan Note (Addendum)
Sildenafil 20 mg  #90 prescribed. Risks and benefits explained

## 2019-04-16 NOTE — Assessment & Plan Note (Signed)
Starting amlodipine 2.5 mg daily qhs .

## 2019-04-16 NOTE — Assessment & Plan Note (Signed)
Last colonoscopy was 3018; sessile polyp removed .  5 yr follow up

## 2019-04-16 NOTE — Telephone Encounter (Signed)
Medication refill was responded to last week. Last sent 04-12-2019. Last seen 04-16-2019

## 2019-04-16 NOTE — Patient Instructions (Signed)
Go get your eyes examined!!!!  I am starting you on a very low dose of generic NORVASC for your blood pressure.  Take it at night.    Goal BP is 130/80 or less   Red Yeast Rice   600 mg twice daily (ok to take with your other supplements) for your cholesterol.  Recheck cholesterol in 3 months   Health Maintenance After Age 71 After age 52, you are at a higher risk for certain long-term diseases and infections as well as injuries from falls. Falls are a major cause of broken bones and head injuries in people who are older than age 44. Getting regular preventive care can help to keep you healthy and well. Preventive care includes getting regular testing and making lifestyle changes as recommended by your health care provider. Talk with your health care provider about:  Which screenings and tests you should have. A screening is a test that checks for a disease when you have no symptoms.  A diet and exercise plan that is right for you. What should I know about screenings and tests to prevent falls? Screening and testing are the best ways to find a health problem early. Early diagnosis and treatment give you the best chance of managing medical conditions that are common after age 16. Certain conditions and lifestyle choices may make you more likely to have a fall. Your health care provider may recommend:  Regular vision checks. Poor vision and conditions such as cataracts can make you more likely to have a fall. If you wear glasses, make sure to get your prescription updated if your vision changes.  Medicine review. Work with your health care provider to regularly review all of the medicines you are taking, including over-the-counter medicines. Ask your health care provider about any side effects that may make you more likely to have a fall. Tell your health care provider if any medicines that you take make you feel dizzy or sleepy.  Osteoporosis screening. Osteoporosis is a condition that causes  the bones to get weaker. This can make the bones weak and cause them to break more easily.  Blood pressure screening. Blood pressure changes and medicines to control blood pressure can make you feel dizzy.  Strength and balance checks. Your health care provider may recommend certain tests to check your strength and balance while standing, walking, or changing positions.  Foot health exam. Foot pain and numbness, as well as not wearing proper footwear, can make you more likely to have a fall.  Depression screening. You may be more likely to have a fall if you have a fear of falling, feel emotionally low, or feel unable to do activities that you used to do.  Alcohol use screening. Using too much alcohol can affect your balance and may make you more likely to have a fall. What actions can I take to lower my risk of falls? General instructions  Talk with your health care provider about your risks for falling. Tell your health care provider if: ? You fall. Be sure to tell your health care provider about all falls, even ones that seem minor. ? You feel dizzy, sleepy, or off-balance.  Take over-the-counter and prescription medicines only as told by your health care provider. These include any supplements.  Eat a healthy diet and maintain a healthy weight. A healthy diet includes low-fat dairy products, low-fat (lean) meats, and fiber from whole grains, beans, and lots of fruits and vegetables. Home safety  Remove any tripping hazards,  such as rugs, cords, and clutter.  Install safety equipment such as grab bars in bathrooms and safety rails on stairs.  Keep rooms and walkways well-lit. Activity   Follow a regular exercise program to stay fit. This will help you maintain your balance. Ask your health care provider what types of exercise are appropriate for you.  If you need a cane or walker, use it as recommended by your health care provider.  Wear supportive shoes that have nonskid  soles. Lifestyle  Do not drink alcohol if your health care provider tells you not to drink.  If you drink alcohol, limit how much you have: ? 0-1 drink a day for women. ? 0-2 drinks a day for men.  Be aware of how much alcohol is in your drink. In the [place] one drink equals one typical bottle of beer (12 oz), one-half glass of wine (5 oz), or one shot of hard liquor (1 oz).  Do not use any products that contain nicotine or tobacco, such as cigarettes and e-cigarettes. If you need help quitting, ask your health care provider. Summary  Having a healthy lifestyle and getting preventive care can help to protect your health and wellness after age 47.  Screening and testing are the best way to find a health problem early and help you avoid having a fall. Early diagnosis and treatment give you the best chance for managing medical conditions that are more common for people who are older than age 71.  Falls are a major cause of broken bones and head injuries in people who are older than age 69. Take precautions to prevent a fall at home.  Work with your health care provider to learn what changes you can make to improve your health and wellness and to prevent falls. This information is not intended to replace advice given to you by your health care provider. Make sure you discuss any questions you have with your health care provider. Document Revised: 05/18/2018 Document Reviewed: 12/08/2016 Elsevier Patient Education  2020 [employer].

## 2019-04-16 NOTE — Telephone Encounter (Signed)
Pt needs refill on traMADol (ULTRAM) 50 MG tablet

## 2019-04-18 ENCOUNTER — Other Ambulatory Visit: Payer: Self-pay | Admitting: Internal Medicine

## 2019-04-18 DIAGNOSIS — M1990 Unspecified osteoarthritis, unspecified site: Secondary | ICD-10-CM

## 2019-04-18 MED ORDER — TRAMADOL HCL 50 MG PO TABS: ORAL_TABLET | ORAL | 1 refills | Status: DC

## 2019-04-18 NOTE — Telephone Encounter (Signed)
Refilled

## 2019-04-18 NOTE — Progress Notes (Signed)
  Based on your fasting cholesterol and your current blood pressure , your 10 year risk of having some type of coronary event (including heart attack) is 9% , meaning that one of  every 11 men with the same medical statistics will have a heart attack or stroke  in the next 10 years.    I recommend treatment.  options   Please schedule an office visit (virtual or tele) to discussion treatment options.  Regards,   Deborra Medina, MD

## 2019-04-27 ENCOUNTER — Encounter: Payer: Self-pay | Admitting: Internal Medicine

## 2019-04-27 ENCOUNTER — Ambulatory Visit (INDEPENDENT_AMBULATORY_CARE_PROVIDER_SITE_OTHER): Payer: PPO | Admitting: Internal Medicine

## 2019-04-27 DIAGNOSIS — E7800 Pure hypercholesterolemia, unspecified: Secondary | ICD-10-CM | POA: Diagnosis not present

## 2019-04-27 DIAGNOSIS — R030 Elevated blood-pressure reading, without diagnosis of hypertension: Secondary | ICD-10-CM

## 2019-04-27 NOTE — Assessment & Plan Note (Signed)
10 yr risk is < 10% without the risk factor of HTN.  Continue red yeast rice ,  Per patient preference along with lifestyle changes which  he is making

## 2019-04-27 NOTE — Assessment & Plan Note (Signed)
He never started the amlodipine.  Since stopping naproxen his BPs have been normal at home. No indication for therapy

## 2019-04-27 NOTE — Progress Notes (Signed)
Telephone Note  This visit type was conducted due to national recommendations for restrictions regarding the COVID-19 pandemic (e.g. social distancing).  This format is felt to be most appropriate for this patient at this time.  All issues noted in this document were discussed and addressed.  No physical exam was performed (except for noted visual exam findings with Video Visits).   I connected with@ on 04/27/19 at  3:30 PM EDT by telephone and verified that I am speaking with the correct person using two identifiers. Location patient: home Location provider: work or home office Persons participating in the virtual visit: patient, provider  I discussed the limitations, risks, security and privacy concerns of performing an evaluation and management service by telephone and the availability of in person appointments. I also discussed with the patient that there may be a patient responsible charge related to this service. The patient expressed understanding and agreed to proceed.  Reason for visit: follow up on labs   HPI:  69 yr old male seen recently for CPE,  Noted to be hypertensive ,  Lipids elevated, with CAD risk of 11% .  Patient has been changing his diet,  Resume RYR and stopped taki Naproxen PM at night,  BPs have responded and improved.  Now 127/73,  And 114/75 . He also had a [facility] Screening event yesterday at the [facility] and will send the results to me    ROS: See pertinent positives and negatives per HPI.  Past Medical History:  Diagnosis Date  . Allergy   . Arthritis   . Chicken pox   . Migraines   . Substance abuse ([place]) 2006   40 pk year tobacco     Past Surgical History:  Procedure Laterality Date  . APPENDECTOMY  1965  . CATARACT EXTRACTION, BILATERAL Bilateral 2012   2013  . COLONOSCOPY WITH PROPOFOL N/A 09/03/2016   Procedure: COLONOSCOPY WITH PROPOFOL;  Surgeon: Manya Silvas, MD;  Location: [facility] ENDOSCOPY;  Service: Endoscopy;  Laterality: N/A;  .  HERNIA REPAIR Right 2011  . HERNIA REPAIR Right 2012   redo of mesh , [place]  . HIP SURGERY Right 2013    Family History  Problem Relation Age of Onset  . Stroke Mother   . Cancer Father        leukemia    SOCIAL HX:  reports that he quit smoking about 14 years ago. His smoking use included cigarettes. He has never used smokeless tobacco. He reports current alcohol use of about 10.0 standard drinks of alcohol per week. He reports that he does not use drugs.   Current Outpatient Medications:  .  omeprazole (PRILOSEC) 20 MG capsule, Take 20 mg by mouth daily as needed., Disp: , Rfl:  .  sildenafil (REVATIO) 20 MG tablet, Take 1 tablet (20 mg total) by mouth 3 (three) times daily., Disp: 90 tablet, Rfl: 5 .  traMADol (ULTRAM) 50 MG tablet, take 1 tablet by mouth every 8 hours if needed, Disp: 90 tablet, Rfl: 1 .  Saw Palmetto 450 MG CAPS, Take 1 capsule by mouth daily., Disp: , Rfl:  .  Turmeric 500 MG CAPS, Take 1 capsule by mouth daily., Disp: , Rfl:   EXAM:  VITALS per patient if applicable:  GENERAL: alert, oriented, appears well and in no acute distress  HEENT: atraumatic, conjunttiva clear, no obvious abnormalities on inspection of external nose and ears  NECK: normal movements of the head and neck  LUNGS: on inspection no signs of  respiratory distress, breathing rate appears normal, no obvious gross SOB, gasping or wheezing  CV: no obvious cyanosis  MS: moves all visible extremities without noticeable abnormality  PSYCH/NEURO: pleasant and cooperative, no obvious depression or anxiety, speech and thought processing grossly intact  ASSESSMENT AND PLAN:  Discussed the following assessment and plan:  Elevated blood pressure reading without diagnosis of hypertension  Pure hypercholesterolemia  Elevated blood pressure reading without diagnosis of hypertension He never started the amlodipine.  Since stopping naproxen his BPs have been normal at home. No indication for  therapy   Hyperlipidemia 10 yr risk is < 10% without the risk factor of HTN.  Continue red yeast rice ,  Per patient preference along with lifestyle changes which  he is making     I discussed the assessment and treatment plan with the patient. The patient was provided an opportunity to ask questions and all were answered. The patient agreed with the plan and demonstrated an understanding of the instructions.   The patient was advised to call back or seek an in-person evaluation if the symptoms worsen or if the condition fails to improve as anticipated.  I provided  25 minutes of non-face-to-face time during this encounter reviewing patient's current problems and past procedures/imaging studies, providing counseling on the above mentioned problems , and coordination  of care .  Crecencio Mc, MD

## 2019-07-24 DIAGNOSIS — Z961 Presence of intraocular lens: Secondary | ICD-10-CM | POA: Diagnosis not present

## 2019-08-31 ENCOUNTER — Telehealth: Payer: Self-pay | Admitting: Internal Medicine

## 2019-08-31 DIAGNOSIS — M1990 Unspecified osteoarthritis, unspecified site: Secondary | ICD-10-CM

## 2019-08-31 MED ORDER — TRAMADOL HCL 50 MG PO TABS: ORAL_TABLET | ORAL | 1 refills | Status: DC

## 2019-08-31 NOTE — Telephone Encounter (Signed)
Pt called in need refill ontraMADol (ULTRAM) 50 MG tablet

## 2019-08-31 NOTE — Telephone Encounter (Signed)
Tramadol has been refilled for 2 months  Please schedule 6 month follow up in September

## 2019-11-21 ENCOUNTER — Other Ambulatory Visit: Payer: Self-pay

## 2019-11-21 ENCOUNTER — Ambulatory Visit (INDEPENDENT_AMBULATORY_CARE_PROVIDER_SITE_OTHER): Payer: PPO | Admitting: Internal Medicine

## 2019-11-21 ENCOUNTER — Encounter: Payer: Self-pay | Admitting: Internal Medicine

## 2019-11-21 ENCOUNTER — Ambulatory Visit (INDEPENDENT_AMBULATORY_CARE_PROVIDER_SITE_OTHER): Payer: PPO

## 2019-11-21 VITALS — BP 124/88 | HR 75 | Temp 97.8°F | Resp 15 | Ht 69.0 in | Wt 163.0 lb

## 2019-11-21 DIAGNOSIS — G8929 Other chronic pain: Secondary | ICD-10-CM

## 2019-11-21 DIAGNOSIS — M1990 Unspecified osteoarthritis, unspecified site: Secondary | ICD-10-CM

## 2019-11-21 DIAGNOSIS — M25511 Pain in right shoulder: Secondary | ICD-10-CM

## 2019-11-21 MED ORDER — PREDNISONE 10 MG PO TABS: ORAL_TABLET | ORAL | 0 refills | Status: DC

## 2019-11-21 MED ORDER — TRAMADOL HCL 50 MG PO TABS: 100.0000 mg | ORAL_TABLET | Freq: Two times a day (BID) | ORAL | 1 refills | Status: DC | PRN

## 2019-11-21 NOTE — Progress Notes (Signed)
Subjective:  Patient ID: Timothy Carrillo, male    DOB: 1951-02-02  Age: 69 y.o. MRN: [id number]  CC: The primary encounter diagnosis was Acute pain of right shoulder. Diagnoses of Arthritis and Chronic right shoulder pain were also pertinent to this visit.  HPI Timothy Carrillo presents for RIGHT SHOULDER PAIN  This visit occurred during the SARS-CoV-2 public health emergency.  Safety protocols were in place, including screening questions prior to the visit, additional usage of staff PPE, and extensive cleaning of exam room while observing appropriate contact time as indicated for disinfecting solutions.   5-6 WEEKS OF persistent right shoulder pain.  No history of fall or trauma.  Can't play golf.  Can't flex or abduct right arm to even the horizontal pain due to severe pain that localizes to the biceps . Denies neck pain, collarbone pain.   Outpatient Medications Prior to Visit  Medication Sig Dispense Refill  . omeprazole (PRILOSEC) 20 MG capsule Take 20 mg by mouth daily as needed.    . sildenafil (REVATIO) 20 MG tablet Take 1 tablet (20 mg total) by mouth 3 (three) times daily. 90 tablet 5  . traMADol (ULTRAM) 50 MG tablet take 1 tablet by mouth every 8 hours if needed 90 tablet 1  . Saw Palmetto 450 MG CAPS Take 1 capsule by mouth daily. (Patient not taking: Reported on 11/21/2019)    . Turmeric 500 MG CAPS Take 1 capsule by mouth daily. (Patient not taking: Reported on 11/21/2019)     No facility-administered medications prior to visit.    Review of Systems;  Patient denies headache, fevers, malaise, unintentional weight loss, skin rash, eye pain, sinus congestion and sinus pain, sore throat, dysphagia,  hemoptysis , cough, dyspnea, wheezing, chest pain, palpitations, orthopnea, edema, abdominal pain, nausea, melena, diarrhea, constipation, flank pain, dysuria, hematuria, urinary  Frequency, nocturia, numbness, tingling, seizures,  Focal weakness, Loss of consciousness,   Tremor, insomnia, depression, anxiety, and suicidal ideation.      Objective:  BP 124/88 (BP Location: Left Arm, Patient Position: Sitting, Cuff Size: Normal)   Pulse 75   Temp 97.8 F (36.6 C) (Oral)   Resp 15   Ht 5\' 9"  (1.753 m)   Wt 163 lb (73.9 kg)   SpO2 99%   BMI 24.07 kg/m   BP Readings from Last 3 Encounters:  11/21/19 124/88  04/27/19 114/75  04/16/19 (!) 152/98    Wt Readings from Last 3 Encounters:  11/21/19 163 lb (73.9 kg)  04/27/19 169 lb 12.8 oz (77 kg)  04/16/19 169 lb 12.8 oz (77 kg)    General appearance: alert, cooperative and appears stated age Ears: normal TM's and external ear canals both ears Throat: lips, mucosa, and tongue normal; teeth and gums normal Neck: no adenopathy, no carotid bruit, supple, symmetrical, trachea midline and thyroid not enlarged, symmetric, no tenderness/mass/nodules Back: symmetric, no curvature. ROM normal. No CVA tenderness. Lungs: clear to auscultation bilaterally Heart: regular rate and rhythm, S1, S2 normal, no murmur, click, rub or gallop Abdomen: soft, non-tender; bowel sounds normal; no masses,  no organomegaly Ext: right shoulder non tender to palpation .  No warmth or effusions,   Pain with passive abduction and flexion limiting ROM to 75 degrees  Pulses: 2+ and symmetric Skin: Skin color, texture, turgor normal. No rashes or lesions Lymph nodes: Cervical, supraclavicular, and axillary nodes normal.  No results found for: HGBA1C  Lab Results  Component Value Date   CREATININE 0.86 04/16/2019   CREATININE  0.96 04/12/2018   CREATININE 0.86 04/01/2017    Lab Results  Component Value Date   WBC 8.2 04/01/2017   HGB 13.6 04/01/2017   HCT 39.5 04/01/2017   PLT 413.0 (H) 04/01/2017   GLUCOSE 95 04/16/2019   CHOL 197 04/16/2019   TRIG 97.0 04/16/2019   HDL 59.90 04/16/2019   LDLDIRECT 102.0 03/31/2016   LDLCALC 118 (H) 04/16/2019   ALT 11 04/16/2019   AST 14 04/16/2019   NA 138 04/16/2019   K 4.4  04/16/2019   CL 104 04/16/2019   CREATININE 0.86 04/16/2019   BUN 15 04/16/2019   CO2 28 04/16/2019   TSH 1.96 04/16/2019   PSA 2.65 04/16/2019   MICROALBUR 1.3 04/16/2019    No results found.  Assessment & Plan:   Problem List Items Addressed This Visit      Unprioritized   Arthritis   Relevant Medications   predniSONE (DELTASONE) 10 MG tablet   traMADol (ULTRAM) 50 MG tablet   Chronic right shoulder pain    Etiology unclear . Exam suggestive of bone spurring vs rotator cuff syndrome .  Plain films ordered .  Prednisone taper prescribed and tramadol refilled.  Referring to orthopedics       Relevant Medications   predniSONE (DELTASONE) 10 MG tablet   traMADol (ULTRAM) 50 MG tablet    Other Visit Diagnoses    Acute pain of right shoulder    -  Primary   Relevant Orders   DG Shoulder Right   Ambulatory referral to Orthopedic Surgery      I have discontinued Yorel E. Persichetti's Saw Palmetto and Turmeric. I have also changed his traMADol. Additionally, I am having him start on predniSONE. Lastly, I am having him maintain his omeprazole and sildenafil.  Meds ordered this encounter  Medications  . predniSONE (DELTASONE) 10 MG tablet    Sig: 6 tablets daily for 3 days, then reduce by 1 tablet daily until gone    Dispense:  33 tablet    Refill:  0  . traMADol (ULTRAM) 50 MG tablet    Sig: Take 2 tablets (100 mg total) by mouth every 12 (twelve) hours as needed. take 1 tablet by mouth every 8 hours if needed    Dispense:  120 tablet    Refill:  1    Medications Discontinued During This Encounter  Medication Reason  . Saw Palmetto 450 MG CAPS   . Turmeric 500 MG CAPS   . traMADol (ULTRAM) 50 MG tablet     I provided  20 minutes of  face-to-face time during this encounter reviewing patient's current problems and past surgeries, labs and imaging studies, providing counseling on the above mentioned problems , and coordination  of care . Follow-up: No follow-ups on  file.   Crecencio Mc, MD

## 2019-11-21 NOTE — Patient Instructions (Signed)
Referral to shoulder specialist in progress   X rays today to rule out spurring   Prednisone 60 mg daily x 3,  Then begin taper by 10 mg daily until gone NO ALEVE OR ADVIL WHILE TAKING PREDNISONE   Continue tramadol . May use up to 4 daily   You can add up to 2000 mg of acetominophen (tylenol) every day safely  In divided doses (500 mg every 6 hours  Or 1000 mg every 12 hours.)

## 2019-11-22 DIAGNOSIS — G8929 Other chronic pain: Secondary | ICD-10-CM | POA: Insufficient documentation

## 2019-11-22 DIAGNOSIS — M7541 Impingement syndrome of right shoulder: Secondary | ICD-10-CM | POA: Insufficient documentation

## 2019-11-22 NOTE — Assessment & Plan Note (Addendum)
Etiology unclear . Exam suggestive of bone spurring vs rotator cuff syndrome .  Plain films ordered .  Prednisone taper prescribed and tramadol refilled.  Referring to orthopedics

## 2019-11-24 NOTE — Progress Notes (Signed)
The x rays of the right shoulder were normal, meaning that no obvious bone spurs or joint issues were noted.  The rotator cuff cannot be imaged without ultrasound or MRI.  If the prednisone does not resolve the pain, a sports medicine or orthopedics evaluation would be the next step.

## 2019-11-27 ENCOUNTER — Telehealth: Payer: Self-pay

## 2019-11-27 NOTE — Telephone Encounter (Signed)
See result note message 

## 2019-11-27 NOTE — Telephone Encounter (Signed)
Pt said he never got his results from Xray. He would like a call back.

## 2019-12-05 DIAGNOSIS — M7541 Impingement syndrome of right shoulder: Secondary | ICD-10-CM | POA: Diagnosis not present

## 2019-12-18 ENCOUNTER — Other Ambulatory Visit: Payer: Self-pay | Admitting: Internal Medicine

## 2020-01-07 DIAGNOSIS — M7541 Impingement syndrome of right shoulder: Secondary | ICD-10-CM | POA: Diagnosis not present

## 2020-04-17 ENCOUNTER — Ambulatory Visit (INDEPENDENT_AMBULATORY_CARE_PROVIDER_SITE_OTHER): Payer: PPO | Admitting: Internal Medicine

## 2020-04-17 ENCOUNTER — Other Ambulatory Visit: Payer: Self-pay

## 2020-04-17 ENCOUNTER — Encounter: Payer: Self-pay | Admitting: Internal Medicine

## 2020-04-17 VITALS — BP 132/85 | HR 59 | Temp 97.9°F | Resp 15 | Ht 69.0 in | Wt 164.4 lb

## 2020-04-17 DIAGNOSIS — M7541 Impingement syndrome of right shoulder: Secondary | ICD-10-CM

## 2020-04-17 DIAGNOSIS — R030 Elevated blood-pressure reading, without diagnosis of hypertension: Secondary | ICD-10-CM

## 2020-04-17 DIAGNOSIS — M1990 Unspecified osteoarthritis, unspecified site: Secondary | ICD-10-CM

## 2020-04-17 DIAGNOSIS — Z125 Encounter for screening for malignant neoplasm of prostate: Secondary | ICD-10-CM

## 2020-04-17 DIAGNOSIS — R339 Retention of urine, unspecified: Secondary | ICD-10-CM

## 2020-04-17 DIAGNOSIS — R002 Palpitations: Secondary | ICD-10-CM

## 2020-04-17 DIAGNOSIS — E7800 Pure hypercholesterolemia, unspecified: Secondary | ICD-10-CM | POA: Diagnosis not present

## 2020-04-17 LAB — LIPID PANEL
Cholesterol: 171 mg/dL (ref 0–200)
HDL: 66.7 mg/dL (ref >39.00)
LDL Cholesterol: 92 mg/dL (ref 0–99)
NonHDL: 104.79
Total CHOL/HDL Ratio: 3
Triglycerides: 63 mg/dL (ref 0.0–149.0)
VLDL: 12.6 mg/dL (ref 0.0–40.0)

## 2020-04-17 LAB — MICROALBUMIN / CREATININE URINE RATIO
Creatinine,U: 228.6 mg/dL
Microalb Creat Ratio: 0.6 mg/g (ref 0.0–30.0)
Microalb, Ur: 1.3 mg/dL (ref 0.0–1.9)

## 2020-04-17 LAB — URINALYSIS, ROUTINE W REFLEX MICROSCOPIC
Bilirubin Urine: NEGATIVE
Hgb urine dipstick: NEGATIVE
Ketones, ur: NEGATIVE
Leukocytes,Ua: NEGATIVE
Nitrite: NEGATIVE
Specific Gravity, Urine: 1.025 (ref 1.000–1.030)
Total Protein, Urine: NEGATIVE
Urine Glucose: NEGATIVE
Urobilinogen, UA: 0.2 (ref 0.0–1.0)
pH: 7 (ref 5.0–8.0)

## 2020-04-17 LAB — CBC WITH DIFFERENTIAL/PLATELET
Basophils Absolute: 0.1 10*3/uL (ref 0.0–0.1)
Basophils Relative: 1.2 % (ref 0.0–3.0)
Eosinophils Absolute: 0.4 10*3/uL (ref 0.0–0.7)
Eosinophils Relative: 7 % — ABNORMAL HIGH (ref 0.0–5.0)
HCT: 42.8 % (ref 39.0–52.0)
Hemoglobin: 14.6 g/dL (ref 13.0–17.0)
Lymphocytes Relative: 19.4 % (ref 12.0–46.0)
Lymphs Abs: 1.2 10*3/uL (ref 0.7–4.0)
MCHC: 34.1 g/dL (ref 30.0–36.0)
MCV: 90.7 fl (ref 78.0–100.0)
Monocytes Absolute: 0.6 10*3/uL (ref 0.1–1.0)
Monocytes Relative: 10.1 % (ref 3.0–12.0)
Neutro Abs: 3.9 10*3/uL (ref 1.4–7.7)
Neutrophils Relative %: 62.3 % (ref 43.0–77.0)
Platelets: 323 10*3/uL (ref 150.0–400.0)
RBC: 4.71 Mil/uL (ref 4.22–5.81)
RDW: 13 % (ref 11.5–15.5)
WBC: 6.3 10*3/uL (ref 4.0–10.5)

## 2020-04-17 LAB — COMPREHENSIVE METABOLIC PANEL
ALT: 10 U/L (ref 0–53)
AST: 14 U/L (ref 0–37)
Albumin: 4.2 g/dL (ref 3.5–5.2)
Alkaline Phosphatase: 52 U/L (ref 39–117)
BUN: 12 mg/dL (ref 6–23)
CO2: 32 mEq/L (ref 19–32)
Calcium: 9.3 mg/dL (ref 8.4–10.5)
Chloride: 103 mEq/L (ref 96–112)
Creatinine, Ser: 0.89 mg/dL (ref 0.40–1.50)
GFR: 85.93 mL/min (ref >60.00)
Glucose, Bld: 89 mg/dL (ref 70–99)
Potassium: 4.5 mEq/L (ref 3.5–5.1)
Sodium: 140 mEq/L (ref 135–145)
Total Bilirubin: 0.3 mg/dL (ref 0.2–1.2)
Total Protein: 6.8 g/dL (ref 6.0–8.3)

## 2020-04-17 LAB — TSH: TSH: 1.93 u[IU]/mL (ref 0.35–4.50)

## 2020-04-17 LAB — MAGNESIUM: Magnesium: 2 mg/dL (ref 1.5–2.5)

## 2020-04-17 LAB — PSA, MEDICARE: PSA: 3.57 ng/ml (ref 0.10–4.00)

## 2020-04-17 MED ORDER — TRAMADOL HCL 50 MG PO TABS: 100.0000 mg | ORAL_TABLET | Freq: Every day | ORAL | 5 refills | Status: DC | PRN

## 2020-04-17 NOTE — Assessment & Plan Note (Addendum)
He has chronic pain of back, hip and knees managed with 2 tramadol qam daily which allows him to play golf  and stay active . Refill history confirmed via [place] Controlled Substance databas, accessed by me today.. refills given for #60/month

## 2020-04-17 NOTE — Patient Instructions (Addendum)
You can add up to 2000 mg of acetominophen (tylenol) every day safely  In divided doses (500 mg every 6 hours  Or 1000 mg every 12 hours.)  This can  Be combined with your tramadol and your aleve pm   I recommend trying melatonin for your insomnia.  It is not a sedative,  But must be taken on  a regular basis to help your internal clock.  Take every evening after dinner start with 3 mg dose   Max effective dose is 6 mg.  WILL NOT CAUSE URINARY RETENTION   PLEASE RECONSIDER YOUR DECISION TO FOREGO THE PNEUMONIA VACCINE  MODERATION IN ALL THINGS IS STILL MY ADVICE....   Health Maintenance After Age 74 After age 54, you are at a higher risk for certain long-term diseases and infections as well as injuries from falls. Falls are a major cause of broken bones and head injuries in people who are older than age 59. Getting regular preventive care can help to keep you healthy and well. Preventive care includes getting regular testing and making lifestyle changes as recommended by your health care provider. Talk with your health care provider about:  Which screenings and tests you should have. A screening is a test that checks for a disease when you have no symptoms.  A diet and exercise plan that is right for you. What should I know about screenings and tests to prevent falls? Screening and testing are the best ways to find a health problem early. Early diagnosis and treatment give you the best chance of managing medical conditions that are common after age 61. Certain conditions and lifestyle choices may make you more likely to have a fall. Your health care provider may recommend:  Regular vision checks. Poor vision and conditions such as cataracts can make you more likely to have a fall. If you wear glasses, make sure to get your prescription updated if your vision changes.  Medicine review. Work with your health care provider to regularly review all of the medicines you are taking, including  over-the-counter medicines. Ask your health care provider about any side effects that may make you more likely to have a fall. Tell your health care provider if any medicines that you take make you feel dizzy or sleepy.  Osteoporosis screening. Osteoporosis is a condition that causes the bones to get weaker. This can make the bones weak and cause them to break more easily.  Blood pressure screening. Blood pressure changes and medicines to control blood pressure can make you feel dizzy.  Strength and balance checks. Your health care provider may recommend certain tests to check your strength and balance while standing, walking, or changing positions.  Foot health exam. Foot pain and numbness, as well as not wearing proper footwear, can make you more likely to have a fall.  Depression screening. You may be more likely to have a fall if you have a fear of falling, feel emotionally low, or feel unable to do activities that you used to do.  Alcohol use screening. Using too much alcohol can affect your balance and may make you more likely to have a fall. What actions can I take to lower my risk of falls? General instructions  Talk with your health care provider about your risks for falling. Tell your health care provider if: ? You fall. Be sure to tell your health care provider about all falls, even ones that seem minor. ? You feel dizzy, sleepy, or off-balance.  Take over-the-counter  and prescription medicines only as told by your health care provider. These include any supplements.  Eat a healthy diet and maintain a healthy weight. A healthy diet includes low-fat dairy products, low-fat (lean) meats, and fiber from whole grains, beans, and lots of fruits and vegetables. Home safety  Remove any tripping hazards, such as rugs, cords, and clutter.  Install safety equipment such as grab bars in bathrooms and safety rails on stairs.  Keep rooms and walkways well-lit. Activity  Follow a regular  exercise program to stay fit. This will help you maintain your balance. Ask your health care provider what types of exercise are appropriate for you.  If you need a cane or walker, use it as recommended by your health care provider.  Wear supportive shoes that have nonskid soles.   Lifestyle  Do not drink alcohol if your health care provider tells you not to drink.  If you drink alcohol, limit how much you have: ? 0-1 drink a day for women. ? 0-2 drinks a day for men.  Be aware of how much alcohol is in your drink. In the [place] one drink equals one typical bottle of beer (12 oz), one-half glass of wine (5 oz), or one shot of hard liquor (1 oz).  Do not use any products that contain nicotine or tobacco, such as cigarettes and e-cigarettes. If you need help quitting, ask your health care provider. Summary  Having a healthy lifestyle and getting preventive care can help to protect your health and wellness after age 85.  Screening and testing are the best way to find a health problem early and help you avoid having a fall. Early diagnosis and treatment give you the best chance for managing medical conditions that are more common for people who are older than age 31.  Falls are a major cause of broken bones and head injuries in people who are older than age 57. Take precautions to prevent a fall at home.  Work with your health care provider to learn what changes you can make to improve your health and wellness and to prevent falls. This information is not intended to replace advice given to you by your health care provider. Make sure you discuss any questions you have with your health care provider. Document Revised: 05/18/2018 Document Reviewed: 12/08/2016 Elsevier Patient Education  2021 [employer].

## 2020-04-17 NOTE — Assessment & Plan Note (Addendum)
Seeing orthopedist Justice Britain.  Conservative management preferred by patient. Had an ineffective steroid injection,  But pain stopped with activity modification (he stopped playing an eletronic game on his tablet)

## 2020-04-17 NOTE — Progress Notes (Signed)
Patient ID: Timothy Carrillo, male    DOB: 09-03-1950  Age: 70 y.o. MRN: [id number]  The patient is here for follow up and  management of other chronic and acute problems.  This visit occurred during the SARS-CoV-2 public health emergency.  Safety protocols were in place, including screening questions prior to the visit, additional usage of staff PPE, and extensive cleaning of exam room while observing appropriate contact time as indicated for disinfecting solutions.   He has declined the Pneumonia and COVID 19 vaccines/   There are no preventive care reminders to display for this patient.   The risk factors are reflected in the social history.  The roster of all physicians providing medical care to patient - is listed in the Snapshot section of the chart.  Activities of daily living:  The patient is 100% independent in all ADLs: dressing, toileting, feeding as well as independent mobility  Home safety : The patient has smoke detectors in the home. They wear seatbelts.  There are no firearms at home. There is no violence in the home.   There is no risks for hepatitis, STDs or HIV. There is no   history of blood transfusion. They have no travel history to infectious disease endemic areas of the world.  The patient has seen their dentist in the last six month. They have seen their eye doctor in the last year. They admit to slight hearing difficulty with regard to whispered voices and some television programs.  They have deferred audiologic testing in the last year.  They do not  have excessive sun exposure. Discussed the need for sun protection: hats, long sleeves and use of sunscreen if there is significant sun exposure.   Diet: the importance of a healthy diet is discussed. They do have a healthy diet.  The benefits of regular aerobic exercise were discussed. She walks 4 times per week ,  20 minutes.   Depression screen: there are no signs or vegative symptoms of depression- irritability,  change in appetite, anhedonia, sadness/tearfullness.  Cognitive assessment: the patient manages all their financial and personal affairs and is actively engaged. They could relate day,date,year and events; recalled 2/3 objects at 3 minutes; performed clock-face test normally.  The following portions of the patient's history were reviewed and updated as appropriate: allergies, current medications, past family history, past medical history,  past surgical history, past social history  and problem list.  Visual acuity was not assessed per patient preference since he  has regular follow up with his ophthalmologist. Hearing and body mass index were assessed and reviewed.  He denies any hearing problems.   During the course of the visit the patient was educated and counseled about appropriate screening and preventive services including : fall prevention , diabetes screening, nutrition counseling, colorectal cancer screening, and recommended immunizations.    CC: The primary encounter diagnosis was Palpitations. Diagnoses of Impingement syndrome of right shoulder, Arthritis, Pure hypercholesterolemia, Elevated blood pressure reading without diagnosis of hypertension, Prostate cancer screening, and Urinary retention were also pertinent to this visit.  1) Elevated blood pressure:   checking BP at home .  Readings all < 140/80  2) Chronic pain     History Nayan has a past medical history of Allergy, Arthritis, Chicken pox, Migraines, and Substance abuse ([place]) (2006).   [name]; Hip surgery (Right, 2013); Hernia repair (Right, 2011); Hernia repair (Right, 2012); Colonoscopy with propofol (N/A, 09/03/2016); and Cataract extraction, bilateral (  Bilateral, 2012).   His family history includes Cancer in his father; Stroke in his mother.He reports that he quit smoking about 15 years ago. His smoking use included cigarettes. He has never used smokeless  tobacco. He reports current alcohol use of about 10.0 standard drinks of alcohol per week. He reports that he does not use drugs.  Outpatient Medications Prior to Visit  Medication Sig Dispense Refill  . omeprazole (PRILOSEC) 20 MG capsule Take 20 mg by mouth daily as needed.    . sildenafil (REVATIO) 20 MG tablet TAKE 1 TABLET(20 MG) BY MOUTH THREE TIMES DAILY 90 tablet 5  . traMADol (ULTRAM) 50 MG tablet Take 2 tablets (100 mg total) by mouth every 12 (twelve) hours as needed. take 1 tablet by mouth every 8 hours if needed 120 tablet 1  . predniSONE (DELTASONE) 10 MG tablet 6 tablets daily for 3 days, then reduce by 1 tablet daily until gone (Patient not taking: Reported on 04/17/2020) 33 tablet 0   No facility-administered medications prior to visit.    Review of Systems  Objective:  BP 132/85 (BP Location: Right Arm, Patient Position: Sitting, Cuff Size: Normal)   Pulse (!) 59   Temp 97.9 F (36.6 C) (Oral)   Resp 15   Ht 5\' 9"  (1.753 m)   Wt 164 lb 6.4 oz (74.6 kg)   SpO2 96%   BMI 24.28 kg/m   Physical Exam    Assessment & Plan:   Problem List Items Addressed This Visit      Unprioritized   Arthritis    He has chronic pain of back, hip and knees managed with 2 tramadol qam daily which allows him to play golf  and stay active . Refill history confirmed via [place] Controlled Substance databas, accessed by me today.. refills given for #60/month      Relevant Medications   traMADol (ULTRAM) 50 MG tablet   Prostate cancer screening   Relevant Orders   PSA, Medicare (Completed)   Elevated blood pressure reading without diagnosis of hypertension    he has no history of hypertension but has had several elevated readings during office visits.  He has beeen periodically checking  at home and submit readings are all 130 to 140/70-80.  No medications indicated .  Lab Results  Component Value Date   CREATININE 0.89 04/17/2020   Lab Results  Component Value Date   NA 140  04/17/2020   K 4.5 04/17/2020   CL 103 04/17/2020   CO2 32 04/17/2020   Lab Results  Component Value Date   MICROALBUR 1.3 04/17/2020   MICROALBUR 1.3 04/16/2019           Relevant Orders   Microalbumin / creatinine urine ratio (Completed)   Hyperlipidemia    10 yr risk is < 15 %  He continues to defer statin therapy AND PREFERS TO Continue red yeast rice and with lifestyle changes   Last lipids Lab Results  Component Value Date   CHOL 171 04/17/2020   HDL 66.70 04/17/2020   LDLCALC 92 04/17/2020   LDLDIRECT 102.0 03/31/2016   TRIG 63.0 04/17/2020   CHOLHDL 3 04/17/2020        Relevant Orders   Lipid panel (Completed)   Impingement syndrome of right shoulder    Seeing orthopedist Justice Britain.  Conservative management preferred by patient. Had an ineffective steroid injection,  But pain stopped with activity modification (he stopped playing an eletronic game on his tablet)  Other Visit Diagnoses    Palpitations    -  Primary   Relevant Orders   Comprehensive metabolic panel (Completed)   TSH (Completed)   Magnesium (Completed)   CBC with Differential/Platelet (Completed)   Urinary retention       Relevant Orders   Urinalysis, Routine w reflex microscopic (Completed)      I have discontinued Muhanad E. Boniface's predniSONE. I have also changed his traMADol. Additionally, I am having him maintain his omeprazole and sildenafil.  Meds ordered this encounter  Medications  . traMADol (ULTRAM) 50 MG tablet    Sig: Take 2 tablets (100 mg total) by mouth daily as needed.    Dispense:  60 tablet    Refill:  5   A total of 40 minutes was spent with patient more than half of which was spent in counseling patient on the above mentioned issues , reviewing and explaining recent labs and imaging studies done, and coordination of care. Medications Discontinued During This Encounter  Medication Reason  . predniSONE (DELTASONE) 10 MG tablet   . traMADol (ULTRAM)  50 MG tablet Reorder    Follow-up: No follow-ups on file.   Crecencio Mc, MD

## 2020-04-19 ENCOUNTER — Encounter: Payer: Self-pay | Admitting: Internal Medicine

## 2020-04-19 NOTE — Assessment & Plan Note (Addendum)
10 yr risk is < 15 %  He continues to defer statin therapy AND PREFERS TO Continue red yeast rice and with lifestyle changes   Last lipids Lab Results  Component Value Date   CHOL 171 04/17/2020   HDL 66.70 04/17/2020   LDLCALC 92 04/17/2020   LDLDIRECT 102.0 03/31/2016   TRIG 63.0 04/17/2020   CHOLHDL 3 04/17/2020

## 2020-04-19 NOTE — Assessment & Plan Note (Signed)
he has no history of hypertension but has had several elevated readings during office visits.  He has beeen periodically checking  at home and submit readings are all 130 to 140/70-80.  No medications indicated .  Lab Results  Component Value Date   CREATININE 0.89 04/17/2020   Lab Results  Component Value Date   NA 140 04/17/2020   K 4.5 04/17/2020   CL 103 04/17/2020   CO2 32 04/17/2020   Lab Results  Component Value Date   MICROALBUR 1.3 04/17/2020   MICROALBUR 1.3 04/16/2019

## 2020-06-30 ENCOUNTER — Telehealth: Payer: Self-pay

## 2020-06-30 NOTE — Telephone Encounter (Signed)
Spoke with pt and informed him that [employer] will not pay for him to have it done in the office but that they will pay if he gets it done at a pharmacy. Pt was advised that if he needs a rx to get it to let mw know and I would send it over to what ever pharmacy hs chooses. Pt gave a verbal understanding.

## 2020-06-30 NOTE — Telephone Encounter (Signed)
Pt would like to get the shingles shot. Please advise

## 2020-07-31 ENCOUNTER — Telehealth: Payer: Self-pay | Admitting: Internal Medicine

## 2020-07-31 NOTE — Telephone Encounter (Signed)
I attempted to leave message for patient to call back and schedule Medicare Annual Wellness Visit (AWV) in office, but voice mail didn't work.   If not able to come in office, please offer to do virtually or by telephone.   Last AWV:03/31/2016  Please schedule at anytime with Nurse Health Advisor.

## 2020-10-14 ENCOUNTER — Telehealth (INDEPENDENT_AMBULATORY_CARE_PROVIDER_SITE_OTHER): Payer: PPO | Admitting: Family Medicine

## 2020-10-14 ENCOUNTER — Telehealth: Payer: Self-pay | Admitting: Internal Medicine

## 2020-10-14 ENCOUNTER — Other Ambulatory Visit: Payer: Self-pay

## 2020-10-14 DIAGNOSIS — U071 COVID-19: Secondary | ICD-10-CM

## 2020-10-14 MED ORDER — NIRMATRELVIR/RITONAVIR (PAXLOVID)TABLET: 3.0000 | ORAL_TABLET | Freq: Two times a day (BID) | ORAL | 0 refills | Status: AC

## 2020-10-14 NOTE — Telephone Encounter (Signed)
Patient informed, Due to the high volume of calls and your symptoms we have to forward your call to our Triage Nurse to expedient your call. Please hold for the transfer.  Patient transferred to [facility] at Access Nurse. Due to testing positive for COVID on Saturday asked patient if he is having any symptoms and patient did not give details.No openings in office or virtual.

## 2020-10-14 NOTE — Telephone Encounter (Signed)
Patient has been instructed by access nurse on home care for covid and if needed to be seen they instructed to schedule an appointment.

## 2020-10-14 NOTE — Progress Notes (Signed)
Virtual Visit via Telephone Note  I connected with Timothy Carrillo on 10/14/20 at 11:40 AM EDT by telephone and verified that I am speaking with the correct person using two identifiers.   I discussed the limitations, risks, security and privacy concerns of performing an evaluation and management service by telephone and the availability of in person appointments. I also discussed with the patient that there may be a patient responsible charge related to this service. The patient expressed understanding and agreed to proceed.  Location patient: home, [place] Location provider: work or home office Participants present for the call: patient, provider Patient did not have a visit with me in the prior 7 days to address this/these issue(s).   History of Present Illness:  Acute telemedicine visit for Covid19: -Onset: 3 days ago; had a positive test 2 days ago -Symptoms include: scratchy throat, cough, body aches, headache, loss of taste -Denies:fever, SOB, CP other than w/ cough, NVD, inability to eat/drink/get out of bed -he has no idea how he got it -Has tried:nyquil -Pertinent past medical history:see below -Pertinent medication allergies: No Known Allergies -COVID-19 vaccine status: has been vaccinated x2 and had a booster -GFR in March 85 and stable  Past Medical History:  Diagnosis Date   Allergy    Arthritis    Chicken pox    Migraines    Substance abuse ([place]) 2006   40 pk year tobacco    Current Outpatient Medications on File Prior to Visit  Medication Sig Dispense Refill   omeprazole (PRILOSEC) 20 MG capsule Take 20 mg by mouth daily as needed.     sildenafil (REVATIO) 20 MG tablet TAKE 1 TABLET(20 MG) BY MOUTH THREE TIMES DAILY 90 tablet 5   traMADol (ULTRAM) 50 MG tablet Take 2 tablets (100 mg total) by mouth daily as needed. 60 tablet 5   No current facility-administered medications on file prior to visit.   Reports has not taken sildenafil or tramadol in months.     Observations/Objective: Patient sounds cheerful and well on the phone. I do not appreciate any SOB. Speech and thought processing are grossly intact. Patient reported vitals:  Assessment and Plan:  COVID-19   Discussed treatment options (infusions and oral options and risk of drug interactions), ideal treatment window, potential complications, isolation and precautions for COVID-19.  Discussed possibility of rebound with antivirals and the need to reisolate if it should occur for 5 days. Checked for/reviewed any labs done in the last 90 days with GFR listed in HPI if available. After lengthy discussion, the patient opted for treatment with  Paxlovid due to being higher risk for complications of covid or severe disease and other factors. Discussed EUA status of this drug and the fact that there is preliminary limited knowledge of risks/interactions/side effects per EUA document vs possible benefits and precautions. This information was shared with patient during the visit and also was provided in patient instructions. Also, advised that patient discuss risks/interactions and use with pharmacist/treatment team as well.   Other symptomatic care measures summarized in patient instructions. Advised to seek prompt in person care if worsening, new symptoms arise, or if is not improving with treatment. Advised of options for inperson care in case PCP office not available. Did let the patient know that I only do telemedicine shifts for [facility] on Tuesdays and Thursdays and advised a follow up visit with PCP or at an [facility] if has further questions or concerns.   Follow Up Instructions:  I did not refer  this patient for an OV with me in the next 24 hours for this/these issue(s).  I discussed the assessment and treatment plan with the patient. The patient was provided an opportunity to ask questions and all were answered. The patient agreed with the plan and demonstrated an understanding of the  instructions.   I spent 18 minutes on the date of this visit in the care of this patient. See summary of tasks completed to properly care for this patient in the detailed notes above which also included counseling of above, review of PMH, medications, allergies, evaluation of the patient and ordering and/or  instructing patient on testing and care options.     Lucretia Kern, DO

## 2020-10-14 NOTE — Patient Instructions (Signed)
HOME CARE TIPS:  -I sent the medication(s) we discussed to your pharmacy: Meds ordered this encounter  Medications   nirmatrelvir/ritonavir EUA (PAXLOVID) 20 x 150 MG & 10 x 100MG TABS    Sig: Take 3 tablets by mouth 2 (two) times daily for 5 days. (Take nirmatrelvir 150 mg two tablets twice daily for 5 days and ritonavir 100 mg one tablet twice daily for 5 days) Patient GFR is 85    Dispense:  30 tablet    Refill:  0     -I sent in the [facility] treatment or referral you requested per our discussion. Please see the information provided below and discuss further with the pharmacist/treatment team.  -If taking Paxlovid, please review all medications, supplement and over the counter drugs with your pharmacist and ask them to check for any interactions. Please make the following changes to your regular medications while taking Paxlovid:  -If taking Paxlovid, there is a chance of rebound illness after finishing your treatment. If you become sick again please isolate for an additional 5 days.    -can use tylenol if needed for fevers, aches and pains per instructions  -can use nasal saline a few times per day if you have nasal congestion  -stay hydrated, drink plenty of fluids and eat small healthy meals - avoid dairy  -If the Covid test is positive, check out the [facility] website for more information on home care, transmission and treatment for COVID19  -follow up with your doctor in 2-3 days unless improving and feeling better  -stay home while sick, except to seek medical care. If you have COVID19, ideally it would be best to stay home for a full 10 days since the onset of symptoms PLUS one day of no fever and feeling better. Wear a good mask that fits snugly (such as N95 or KN95) if around others to reduce the risk of transmission.  It was nice to meet you today, and I really hope you are feeling better soon. I help [facility] out with telemedicine visits on Tuesdays and Thursdays and am  available for visits on those days. If you have any concerns or questions following this visit please schedule a follow up visit with your Primary Care doctor or seek care at a local urgent care clinic to avoid delays in care.   FACT SHEET FOR PATIENTS, PARENTS, AND CAREGIVERS EMERGENCY USE AUTHORIZATION (EUA) OF PAXLOVID FOR CORONAVIRUS DISEASE 2019 (COVID-19) You are being given this Fact Sheet because your healthcare provider believes it is necessary to provide you with PAXLOVID for the treatment of mild-to-moderate coronavirus disease (COVID-19) caused by the SARS-CoV-2 virus. This Fact Sheet contains information to help you understand the risks and benefits of taking the PAXLOVID you have received or may receive. The U.S. Food and Drug Administration (FDA) has issued an Emergency Use Authorization (EUA) to make PAXLOVID available during the COVID-19 pandemic (for more details about an EUA please see "What is an Emergency Use Authorization?" at the end of this document). PAXLOVID is not an FDA-approved medicine in the [place]. Read this Fact Sheet for information about PAXLOVID. Talk to your healthcare provider about your options or if you have any questions. It is your choice to take PAXLOVID.  What is COVID-19? COVID-19 is caused by a virus called a coronavirus. You can get COVID-19 through close contact with another person who has the virus. COVID-19 illnesses have ranged from very mild-to-severe, including illness resulting in death. While information so far suggests that  most COVID-19 illness is mild, serious illness can happen and may cause some of your other medical conditions to become worse. Older people and people of all ages with severe, long lasting (chronic) medical conditions like heart disease, lung disease, and diabetes, for example seem to be at higher risk of being hospitalized for COVID-19.  What is PAXLOVID? PAXLOVID is an investigational medicine used to  treat mild-to-moderate COVID-19 in adults and children [60 years of age and older weighing at least 33 pounds (38 kg)] with positive results of direct SARS-CoV-2 viral testing, and who are at high risk for progression to severe COVID-19, including hospitalization or death. PAXLOVID is investigational because it is still being studied. There is limited information about the safety and effectiveness of using PAXLOVID to treat people with mild-to-moderate COVID-19.  The FDA has authorized the emergency use of PAXLOVID for the treatment of mild-tomoderate COVID-19 in adults and children [76 years of age and older weighing at least 35 pounds (61 kg)] with a positive test for the virus that causes COVID-19, and who are at high risk for progression to severe COVID-19, including hospitalization or death, under an EUA. 1 Revised: 25 April 2020   What should I tell my healthcare provider before I take PAXLOVID? Tell your healthcare provider if you: ? Have any allergies ? Have liver or kidney disease ? Are pregnant or plan to become pregnant ? Are breastfeeding a child ? Have any serious illnesses  Tell your healthcare provider about all the medicines you take, including prescription and over-the-counter medicines, vitamins, and herbal supplements. Some medicines may interact with PAXLOVID and may cause serious side effects. Keep a list of your medicines to show your healthcare provider and pharmacist when you get a new medicine.  You can ask your healthcare provider or pharmacist for a list of medicines that interact with PAXLOVID. Do not start taking a new medicine without telling your healthcare provider. Your healthcare provider can tell you if it is safe to take PAXLOVID with other medicines.  Tell your healthcare provider if you are taking combined hormonal contraceptive. PAXLOVID may affect how your birth control pills work. Females who are able to become pregnant should use another  effective alternative form of contraception or an additional barrier method of contraception. Talk to your healthcare provider if you have any questions about contraceptive methods that might be right for you.  How do I take PAXLOVID? ? PAXLOVID consists of 2 medicines: nirmatrelvir and ritonavir. o Take 2 pink tablets of nirmatrelvir with 1 white tablet of ritonavir by mouth 2 times each day (in the morning and in the evening) for 5 days. For each dose, take all 3 tablets at the same time. o If you have kidney disease, talk to your healthcare provider. You may need a different dose. ? Swallow the tablets whole. Do not chew, break, or crush the tablets. ? Take PAXLOVID with or without food. ? Do not stop taking PAXLOVID without talking to your healthcare provider, even if you feel better. ? If you miss a dose of PAXLOVID within 8 hours of the time it is usually taken, take it as soon as you remember. If you miss a dose by more than 8 hours, skip the missed dose and take the next dose at your regular time. Do not take 2 doses of PAXLOVID at the same time. ? If you take too much PAXLOVID, call your healthcare provider or go to the nearest hospital emergency room right away. ?  If you are taking a ritonavir- or cobicistat-containing medicine to treat hepatitis C or Human Immunodeficiency Virus (HIV), you should continue to take your medicine as prescribed by your healthcare provider. 2 Revised: 25 April 2020    Talk to your healthcare provider if you do not feel better or if you feel worse after 5 days.  Who should generally not take PAXLOVID? Do not take PAXLOVID if: ? You are allergic to nirmatrelvir, ritonavir, or any of the ingredients in PAXLOVID. ? You are taking any of the following medicines: o Alfuzosin o Pethidine, propoxyphene o Ranolazine o Amiodarone, dronedarone, flecainide, propafenone, quinidine o Colchicine o Lurasidone, pimozide, clozapine o Dihydroergotamine,  ergotamine, methylergonovine o Lovastatin, simvastatin o Sildenafil (Revatio) for pulmonary arterial hypertension (PAH) o Triazolam, oral midazolam o Apalutamide o Carbamazepine, phenobarbital, phenytoin o Rifampin o St. John's Wort (hypericum perforatum) Taking PAXLOVID with these medicines may cause serious or life-threatening side effects or affect how PAXLOVID works.  These are not the only medicines that may cause serious side effects if taken with PAXLOVID. PAXLOVID may increase or decrease the levels of multiple other medicines. It is very important to tell your healthcare provider about all of the medicines you are taking because additional laboratory tests or changes in the dose of your other medicines may be necessary while you are taking PAXLOVID. Your healthcare provider may also tell you about specific symptoms to watch out for that may indicate that you need to stop or decrease the dose of some of your other medicines.  What are the important possible side effects of PAXLOVID? Possible side effects of PAXLOVID are: ? Allergic Reactions. Allergic reactions can happen in people taking PAXLOVID, even after only 1 dose. Stop taking PAXLOVID and call your healthcare provider right away if you get any of the following symptoms of an allergic reaction: o hives o trouble swallowing or breathing o swelling of the mouth, lips, or face o throat tightness o hoarseness 3 Revised: 25 April 2020  o skin rash ? Liver Problems. Tell your healthcare provider right away if you have any of these signs and symptoms of liver problems: loss of appetite, yellowing of your skin and the whites of eyes (jaundice), dark-colored urine, pale colored stools and itchy skin, stomach area (abdominal) pain. ? Resistance to HIV Medicines. If you have untreated HIV infection, PAXLOVID may lead to some HIV medicines not working as well in the future. ? Other possible side effects include: o altered  sense of taste o diarrhea o high blood pressure o muscle aches These are not all the possible side effects of PAXLOVID. Not many people have taken PAXLOVID. Serious and unexpected side effects may happen. PAXLOVID is still being studied, so it is possible that all of the risks are not known at this time.  What other treatment choices are there? Veklury (remdesivir) is FDA-approved for the treatment of mild-to-moderate GUYQI-34 in certain adults and children. Talk with your doctor to see if Marijean Heath is appropriate for you. Like PAXLOVID, FDA may also allow for the emergency use of other medicines to treat people with COVID-19. Go to https://price.info/ for information on the emergency use of other medicines that are authorized by FDA to treat people with COVID-19. Your healthcare provider may talk with you about clinical trials for which you may be eligible. It is your choice to be treated or not to be treated with PAXLOVID. Should you decide not to receive it or for your child not to receive it, it will  not change your standard medical care.  What if I am pregnant or breastfeeding? There is no experience treating pregnant women or breastfeeding mothers with PAXLOVID. For a mother and unborn baby, the benefit of taking PAXLOVID may be greater than the risk from the treatment. If you are pregnant, discuss your options and specific situation with your healthcare provider. It is recommended that you use effective barrier contraception or do not have sexual activity while taking PAXLOVID. If you are breastfeeding, discuss your options and specific situation with your healthcare provider. 4 Revised: 25 April 2020   How do I report side effects with PAXLOVID? Contact your healthcare provider if you have any side effects that bother you or do not go away. Report side effects to FDA  MedWatch at SmoothHits.hu or call 1-800-FDA1088 or you can report side effects to [employer]. at the contact information provided below. Website Fax number Telephone number www.pfizersafetyreporting.com [phone] How should I store [place]? Store PAXLOVID tablets at room temperature, between 68?F to 77?F (20?C to 25?C). How can I learn more about COVID-19? ? Ask your healthcare provider. ? Visit https://jacobson-johnson.com/. ? Contact your local or state public health department. What is an Emergency Use Authorization (EUA)? The [place] FDA has made PAXLOVID available under an emergency access mechanism called an Emergency Use Authorization (EUA). The EUA is supported by a Education officer, museum and Human Service (HHS) declaration that circumstances exist to justify the emergency use of drugs and biological products during the COVID-19 pandemic. PAXLOVID for the treatment of mild-to-moderate COVID-19 in adults and children [60 years of age and older weighing at least 16 pounds (42 kg)] with positive results of direct SARS-CoV-2 viral testing, and who are at high risk for progression to severe COVID-19, including hospitalization or death, has not undergone the same type of review as an FDA-approved product. In issuing an EUA under the RJPVG-68 public health emergency, the FDA has determined, among other things, that based on the total amount of scientific evidence available including data from adequate and well-controlled clinical trials, if available, it is reasonable to believe that the product may be effective for diagnosing, treating, or preventing COVID-19, or a serious or life-threatening disease or condition caused by COVID-19; that the known and potential benefits of the product, when used to diagnose, treat, or prevent such disease or condition, outweigh the known and potential risks of such product; and that there are no adequate, approved, and  available alternatives. All of these criteria must be met to allow for the product to be used in the treatment of patients during the COVID-19 pandemic. The EUA for PAXLOVID is in effect for the duration of the COVID-19 declaration justifying emergency use of this product, unless terminated or revoked (after which the products may no longer be used under the EUA). 5 Revised: 25 April 2020     Additional Information For general questions, visit the website or call the telephone number provided below. Website Telephone number www.COVID19oralRx.com ([phone] (1-877-C19-PACK) You can also go to www.pfizermedinfo.com or call [phone] for more information. [id number] Revised: 25 April 2020     Seek in person care or schedule a follow up video visit promptly if your symptoms worsen, new concerns arise or you are not improving with treatment. Call 911 and/or seek emergency care if your symptoms are severe or life threatening.

## 2021-01-28 ENCOUNTER — Other Ambulatory Visit: Payer: Self-pay

## 2021-01-28 ENCOUNTER — Encounter: Payer: Self-pay | Admitting: Internal Medicine

## 2021-01-28 ENCOUNTER — Ambulatory Visit (INDEPENDENT_AMBULATORY_CARE_PROVIDER_SITE_OTHER): Payer: PPO | Admitting: Internal Medicine

## 2021-01-28 VITALS — BP 152/90 | HR 85 | Temp 97.8°F | Ht 69.0 in | Wt 159.8 lb

## 2021-01-28 DIAGNOSIS — N402 Nodular prostate without lower urinary tract symptoms: Secondary | ICD-10-CM

## 2021-01-28 DIAGNOSIS — R030 Elevated blood-pressure reading, without diagnosis of hypertension: Secondary | ICD-10-CM | POA: Diagnosis not present

## 2021-01-28 DIAGNOSIS — M25551 Pain in right hip: Secondary | ICD-10-CM | POA: Diagnosis not present

## 2021-01-28 DIAGNOSIS — G8929 Other chronic pain: Secondary | ICD-10-CM | POA: Diagnosis not present

## 2021-01-28 DIAGNOSIS — M5441 Lumbago with sciatica, right side: Secondary | ICD-10-CM

## 2021-01-28 MED ORDER — TRAMADOL HCL 50 MG PO TABS: 100.0000 mg | ORAL_TABLET | Freq: Three times a day (TID) | ORAL | 0 refills | Status: AC | PRN

## 2021-01-28 NOTE — Progress Notes (Signed)
Subjective:  Patient ID: Timothy Carrillo, male    DOB: 14-Feb-1950  Age: 70 y.o. MRN: [id number]  CC: The primary encounter diagnosis was Chronic right hip pain. Diagnoses of Elevated blood pressure reading without diagnosis of hypertension, Prostate nodule, and Acute right-sided low back pain with right-sided sciatica were also pertinent to this visit.  HPI WILLIA Carrillo presents for  Chief Complaint  Patient presents with   Back Pain    Pt stated that he injured his back over a month ago.   This visit occurred during the SARS-CoV-2 public health emergency.  Safety protocols were in place, including screening questions prior to the visit, additional usage of staff PPE, and extensive cleaning of exam room while observing appropriate contact time as indicated for disinfecting solutions.   Cc:  diffuse back pain for the past month.started after bending over from a seated position to adjust something on his riding lawn mower  . The Pain was excruciating,  could barely walk .spent 4 days on crutches and in bed .  Couldn't even raise head without having Severe pain I his spine . Saw chiropractor after 3 weeks  of persistent pain  Yevonne Pax in [place])  Plain films reportedly suggested DJD right hip, severe,  and disk herniation at unknown lumbar level.  Therapy included lengthening of spine which did not help.  cannot cross right leg over left  .  Currently pain is 3 but excalates if he bends over .  Currently his pain is in his  lower back and right ip and radiates to knee but not below.  He demonstrates that he can swing a golf club without significant pain   History of back injury remotely over 30 yrs ago ,   last MRI was done in 2014 , ordered by neurosurgeon .  No indication for surgery , was sent for  ESI x 3 which helped relieve pain .  Taking 2 tramadol,  plus advil /tylenol combination 2 pills daily .with little relief . Not sleeping well due to pain with sleeping.  Using a  "sleeping pill" by [facility] 2 per night , cannot tell me what it is.  He has BPH with nocturia , improved with use of the sleeping pill.    In reviewing the MRI report of the right hip today from  2013 ordered by [facility] ,  there was an Incidental finding of a prostate nodule that was never worked up (no referral to Urology) . No prior lumbar films.  MRI right hip in 2013 noted a labral tear ([place]).  Had a procedure    COVID 19 infection I n  Sept despite vaccination and booster   Elevated blood pressure:  does not check at home    Outpatient Medications Prior to Visit  Medication Sig Dispense Refill   omeprazole (PRILOSEC) 20 MG capsule Take 20 mg by mouth daily as needed.     sildenafil (REVATIO) 20 MG tablet TAKE 1 TABLET(20 MG) BY MOUTH THREE TIMES DAILY 90 tablet 5   traMADol (ULTRAM) 50 MG tablet Take 2 tablets (100 mg total) by mouth daily as needed. 60 tablet 5   No facility-administered medications prior to visit.    Review of Systems;  Patient denies headache, fevers, malaise, unintentional weight loss, skin rash, eye pain, sinus congestion and sinus pain, sore throat, dysphagia,  hemoptysis , cough, dyspnea, wheezing, chest pain, palpitations, orthopnea, edema, abdominal pain, nausea, melena, diarrhea, constipation, flank pain, dysuria, hematuria, urinary  Frequency,  nocturia, numbness, tingling, seizures,  Focal weakness, Loss of consciousness,  Tremor, insomnia, depression, anxiety, and suicidal ideation.      Objective:  BP (!) 152/90 (BP Location: Left Arm, Patient Position: Sitting, Cuff Size: Normal)    Pulse 85    Temp 97.8 F (36.6 C) (Oral)    Ht 5\' 9"  (1.753 m)    Wt 159 lb 12.8 oz (72.5 kg)    SpO2 99%    BMI 23.60 kg/m   BP Readings from Last 3 Encounters:  01/28/21 (!) 152/90  04/17/20 132/85  11/21/19 124/88    Wt Readings from Last 3 Encounters:  01/28/21 159 lb 12.8 oz (72.5 kg)  04/17/20 164 lb 6.4 oz (74.6 kg)  11/21/19 163 lb (73.9 kg)    General  appearance: alert, cooperative and appears stated age Ears: normal TM's and external ear canals both ears Throat: lips, mucosa, and tongue normal; teeth and gums normal Neck: no adenopathy, no carotid bruit, supple, symmetrical, trachea midline and thyroid not enlarged, symmetric, no tenderness/mass/nodules Back: symmetric, no curvature. ROM normal. No CVA tenderness. Lungs: clear to auscultation bilaterally Heart: regular rate and rhythm, S1, S2 normal, no murmur, click, rub or gallop Abdomen: soft, non-tender; bowel sounds normal; no masses,  no organomegaly Pulses: 2+ and symmetric Skin: Skin color, texture, turgor normal. No rashes or lesions Lymph nodes: Cervical, supraclavicular, and axillary nodes normal.  No results found for: HGBA1C  Lab Results  Component Value Date   CREATININE 0.89 04/17/2020   CREATININE 0.86 04/16/2019   CREATININE 0.96 04/12/2018    Lab Results  Component Value Date   WBC 6.3 04/17/2020   HGB 14.6 04/17/2020   HCT 42.8 04/17/2020   PLT 323.0 04/17/2020   GLUCOSE 89 04/17/2020   CHOL 171 04/17/2020   TRIG 63.0 04/17/2020   HDL 66.70 04/17/2020   LDLDIRECT 102.0 03/31/2016   LDLCALC 92 04/17/2020   ALT 10 04/17/2020   AST 14 04/17/2020   NA 140 04/17/2020   K 4.5 04/17/2020   CL 103 04/17/2020   CREATININE 0.89 04/17/2020   BUN 12 04/17/2020   CO2 32 04/17/2020   TSH 1.93 04/17/2020   PSA 3.57 04/17/2020   MICROALBUR 1.3 04/17/2020    No results found.  Assessment & Plan:   Problem List Items Addressed This Visit     Elevated blood pressure reading without diagnosis of hypertension    Has not been checking BP at home.  He is in pain today ,  Will need to return for BP check in one week       Prostate nodule    Incidental finding upon review of 2013 MRI hip.  Urology referral recommended and in progress  Lab Results  Component Value Date   PSA 3.57 04/17/2020   PSA 2.65 04/16/2019   PSA 3.06 04/12/2018          Relevant  Orders   Ambulatory referral to Urology   Chronic right hip pain - Primary    Unclear how much of his current pain is originating from the hip,  Which had a labral tear in 2013 ,  Or from his lumbar spine .   Referring to Orthopedic Surgery.  Refill tramadol for increased use : 100 mg every 8 hours ,       Relevant Medications   traMADol (ULTRAM) 50 MG tablet   Other Relevant Orders   Ambulatory referral to Orthopedic Surgery   Low back pain    Acute on  chronic  With right hip degenerative changes complicating pain syndrome.  Prior MRI noted no surgical issues in 2013 .  He notes some relief of pain with extension exercises demonstrated in office today.  Home exercises outlined,  Orthopedics  referral in progress       Relevant Medications   traMADol (ULTRAM) 50 MG tablet   Meds ordered this encounter  Medications   traMADol (ULTRAM) 50 MG tablet    Sig: Take 2 tablets (100 mg total) by mouth every 8 (eight) hours as needed for up to 7 days. For back and hip pain    Dispense:  42 tablet    Refill:  0  Follow-up: Return in about 1 week (around 02/04/2021).    I spent 40 mintutes dedicated to the care of this patient on the date of this encounter to include pre-visit review of his medical history,  Face-to-face time with the patient , and post visit ordering of testing and therapeutics.   Crecencio Mc, MD

## 2021-01-28 NOTE — Assessment & Plan Note (Signed)
Has not been checking BP at home.  He is in pain today ,  Will need to return for BP check in one week

## 2021-01-28 NOTE — Patient Instructions (Signed)
Tramadol has been renewed  for use 100 mg every 8 hours.   Ok to add advil/tylenol 3 times daily .  Refills can be requested  Return in one week for BP check.  BP was HIGH today   Exercise: (supported back extension, standing)  Stand against a counter or sofa with your buttocks resting on the edge and your hands on the edge as well on either side of your back  Slowly lean backwards,  Bending from the waist, until you feel slight discomfort. Restore yourself to vertical position (up straight) Repeat the back extension 10 times , each time extending a little farther).  What should you expect? The pain should recede from the calf/thigh/buttocks but may localize to the lower back  If it does not,  Or if it makes the leg pain worse, STOP doing it.   If it results in improvement,  Repeat the exercise every 2-3 hours while awake and STOP THE OTHER EXERCISES that do the opposite motion (back flexion)    Urology referral to determine if you have a prostate nodule  Orthopedics referral for right hip

## 2021-01-28 NOTE — Assessment & Plan Note (Signed)
Incidental finding upon review of 2013 MRI hip.  Urology referral recommended and in progress  Lab Results  Component Value Date   PSA 3.57 04/17/2020   PSA 2.65 04/16/2019   PSA 3.06 04/12/2018

## 2021-01-30 DIAGNOSIS — G8929 Other chronic pain: Secondary | ICD-10-CM | POA: Insufficient documentation

## 2021-01-30 DIAGNOSIS — M25551 Pain in right hip: Secondary | ICD-10-CM | POA: Insufficient documentation

## 2021-01-30 DIAGNOSIS — M545 Low back pain, unspecified: Secondary | ICD-10-CM | POA: Insufficient documentation

## 2021-01-30 NOTE — Assessment & Plan Note (Signed)
Unclear how much of his current pain is originating from the hip,  Which had a labral tear in 2013 ,  Or from his lumbar spine .   Referring to Orthopedic Surgery.  Refill tramadol for increased use : 100 mg every 8 hours ,

## 2021-01-30 NOTE — Assessment & Plan Note (Signed)
Acute on chronic  With right hip degenerative changes complicating pain syndrome.  Prior MRI noted no surgical issues in 2013 .  He notes some relief of pain with extension exercises demonstrated in office today.  Home exercises outlined,  Orthopedics  referral in progress

## 2021-02-04 ENCOUNTER — Ambulatory Visit (INDEPENDENT_AMBULATORY_CARE_PROVIDER_SITE_OTHER): Payer: PPO

## 2021-02-04 ENCOUNTER — Other Ambulatory Visit: Payer: Self-pay

## 2021-02-04 VITALS — BP 150/86 | HR 95

## 2021-02-04 DIAGNOSIS — I10 Essential (primary) hypertension: Secondary | ICD-10-CM | POA: Diagnosis not present

## 2021-02-04 NOTE — Progress Notes (Addendum)
Patient here for nurse visit BP check per order from Deborra Medina MD.   Pt doesn't take BP medications  BP Readings from Last 3 Encounters:  02/04/21 (!) 150/86  01/28/21 (!) 152/90  04/17/20 132/85   Pulse Readings from Last 3 Encounters:  02/04/21 95  01/28/21 85  04/17/20 (!) 59    Any Headaches? Dizziness? Light Headedness? Pt reports none of these symptoms   Baron Hamper, CMA  Needs to start amlodipine 5 mg daily.  Rx sent to pharmacy  Regards,   Deborra Medina, MD

## 2021-02-06 ENCOUNTER — Telehealth: Payer: Self-pay | Admitting: Internal Medicine

## 2021-02-06 DIAGNOSIS — M25551 Pain in right hip: Secondary | ICD-10-CM

## 2021-02-06 NOTE — Telephone Encounter (Signed)
Rejection Reason - Patient did not respond - We have contacted this patient 2 times, left 2 messages and sent a SMS msg. This patient has not contacted [place] back to schedule. Referral is being closed due to time sensitivity but pt has our info to call and schedule. We will still be happy to assist your office and the patient. Thank you!" Virl Cagey said on Feb 06, 2021 9:13 AM  "Thank you for the referral. We have left a voicemail and sent a SMS msg for this patient to contact our office to schedule an appointment. We will keep you updated as we can. Thank you" Virl Cagey said on Feb 03, 2021 4:22 PM  Msg from [facility]

## 2021-02-06 NOTE — Assessment & Plan Note (Signed)
He was referred to [facility] but has not returned their calls .  Dec 2022

## 2021-02-09 ENCOUNTER — Telehealth: Payer: Self-pay | Admitting: Internal Medicine

## 2021-02-09 MED ORDER — AMLODIPINE BESYLATE 5 MG PO TABS: 5.0000 mg | ORAL_TABLET | Freq: Every day | ORAL | 1 refills | Status: DC

## 2021-02-09 NOTE — Telephone Encounter (Signed)
RN visit for BP reviewed .  Needs to start amlodipine .  Rx sent

## 2021-02-10 NOTE — Telephone Encounter (Signed)
Pt returning call

## 2021-02-10 NOTE — Telephone Encounter (Signed)
LMTCB

## 2021-02-10 NOTE — Telephone Encounter (Signed)
Spoke with pt and advised him of the medication that he needs to start taking for his bp. Also advised pt that once he starts on the medication to check his bp once daily for a week and send [place] the readings. Pt gave a verbal understanding.

## 2021-02-13 NOTE — Telephone Encounter (Signed)
Advised patient of PCP advice and patient declined going to ED stating this time does not work for him he is going to the RV show, and he will advise his friend the cardiologist retired that he has these symptoms and will go at the end of the day when he gets back from RV show. Advised he should call 911 if symptoms worsen while at RV show and advise any one with him to immediately call 911. While reiterating to him he really need to go now to the ER and to please advise his friend the cardiologist on arrival what is going on so they can advise him to go to the ED. Patient stated he would let his friend the cardiologist know.

## 2021-02-13 NOTE — Telephone Encounter (Signed)
Called patient Timothy Carrillo stated first that chest pain feels its subsiding.  Patient stated Timothy Carrillo started amlodipine 5 mg X 2 days ago and Timothy Carrillo noticed yesterday that as the day went on Timothy Carrillo started developing chest pressure with a cough and then last night Timothy Carrillo felt pressure and squeezing that rated to the back and pain worsened with cough, no nausea, no sweating or clamminess, denies SOB and did not worsen with activity and  did not ease with rest. Patient held Amlodipine today as is starting to feel better says the chest pressure is there but Timothy Carrillo say Timothy Carrillo can feel it subsiding and the cough has eased.  Advised patient due to symptoms this is hard to say whether heart related , Patient "interrupted and stated Timothy Carrillo read side effect and the first one listed is can cause chest pain" Nurse advised either way with chest pain Timothy Carrillo should be evaluated due to we can rule out that its not the heart without testing. Patient declined stating Timothy Carrillo is going with his best friend a cardiologist today to a RV show and that if pain returns Timothy Carrillo will let  the friend know that's a cardiologist. Patient leaving at 62 fo rRV show would like cal back before Timothy Carrillo goes or after 4 today>

## 2021-02-13 NOTE — Telephone Encounter (Addendum)
Pt called in stating that he want to talk to [facility] or the CMA regarding his BP. Pt felt like his chest was going to explode last night. I was trying to get more information out of him but pt just said to tell Janett Billow to call me. I told pt to hold on so I could allow him to talk to a nurse but pt hung up on me.

## 2021-02-17 NOTE — Telephone Encounter (Signed)
Spoke with pt and confirmed that he is not still having the chest pain. Pt stated that since stopping the amlodipine he has not had the pain in his chest. Pt is scheduled for a follow up with Dr. Derrel Nip on Thursday.

## 2021-02-17 NOTE — Telephone Encounter (Addendum)
Pt called in stating that he had an allergic reaction to Blood Pressure medication. Pt stated that a nurse from our office called him back and advise Pt to get an EKG. Pt called in to schedule EKG. No orders are in system. Advise by Juliann Pulse, Pt needs to be schedule with his provider. Advise by Juliann Pulse to send  message back. Schedule Pt appt with Dr. Derrel Nip on 02/19/2021 at [facility]

## 2021-02-18 NOTE — Telephone Encounter (Signed)
noted 

## 2021-02-19 ENCOUNTER — Other Ambulatory Visit: Payer: Self-pay

## 2021-02-19 ENCOUNTER — Encounter: Payer: Self-pay | Admitting: Internal Medicine

## 2021-02-19 ENCOUNTER — Ambulatory Visit (INDEPENDENT_AMBULATORY_CARE_PROVIDER_SITE_OTHER): Payer: PPO

## 2021-02-19 ENCOUNTER — Ambulatory Visit (INDEPENDENT_AMBULATORY_CARE_PROVIDER_SITE_OTHER): Payer: PPO | Admitting: Internal Medicine

## 2021-02-19 VITALS — BP 144/86 | HR 62 | Temp 97.7°F | Ht 69.0 in | Wt 163.8 lb

## 2021-02-19 DIAGNOSIS — K2100 Gastro-esophageal reflux disease with esophagitis, without bleeding: Secondary | ICD-10-CM

## 2021-02-19 DIAGNOSIS — Z87898 Personal history of other specified conditions: Secondary | ICD-10-CM

## 2021-02-19 DIAGNOSIS — R002 Palpitations: Secondary | ICD-10-CM | POA: Diagnosis not present

## 2021-02-19 DIAGNOSIS — R0789 Other chest pain: Secondary | ICD-10-CM | POA: Diagnosis not present

## 2021-02-19 DIAGNOSIS — I10 Essential (primary) hypertension: Secondary | ICD-10-CM | POA: Diagnosis not present

## 2021-02-19 DIAGNOSIS — J321 Chronic frontal sinusitis: Secondary | ICD-10-CM | POA: Diagnosis not present

## 2021-02-19 DIAGNOSIS — J329 Chronic sinusitis, unspecified: Secondary | ICD-10-CM | POA: Insufficient documentation

## 2021-02-19 DIAGNOSIS — R079 Chest pain, unspecified: Secondary | ICD-10-CM | POA: Diagnosis not present

## 2021-02-19 DIAGNOSIS — J320 Chronic maxillary sinusitis: Secondary | ICD-10-CM | POA: Insufficient documentation

## 2021-02-19 MED ORDER — AMOXICILLIN-POT CLAVULANATE 875-125 MG PO TABS: 1.0000 | ORAL_TABLET | Freq: Two times a day (BID) | ORAL | 0 refills | Status: DC

## 2021-02-19 MED ORDER — TELMISARTAN 40 MG PO TABS
40.0000 mg | ORAL_TABLET | Freq: Every day | ORAL | 2 refills | Status: DC
Start: 2021-02-19 — End: 2021-04-24

## 2021-02-19 MED ORDER — OMEPRAZOLE 20 MG PO CPDR: 20.0000 mg | DELAYED_RELEASE_CAPSULE | Freq: Every day | ORAL | 1 refills | Status: DC

## 2021-02-19 NOTE — Assessment & Plan Note (Signed)
With persistent cough.  Chest x ray clear. .  Given chronicity of symptoms, d Will treat with empiric antibiotics, decongestants, prednisone taper, ough suppressant and saline lavage.   Probiotic advised

## 2021-02-19 NOTE — Assessment & Plan Note (Addendum)
I have ordered and reviewed a 12 lead EKG and find that there are no acute changes and patient is in sinus rhythm.    He has declined cardiology referral . Chest x ray Is normal/   He attributes the chest pain to amlodipine and is willing to start another medication.  Will resume treatment for GERD

## 2021-02-19 NOTE — Assessment & Plan Note (Signed)
Advised to resume omeprazole

## 2021-02-19 NOTE — Patient Instructions (Addendum)
Chest x ray today  I will prescribe an antibiotic for the sinus infection once I see the chest x ray  Start telmisartan 40 mg daily .   Return in about 1 week for RN visit AND A FASTING  LAB VISIT  BRING YOUR HOME BP  MACHINE

## 2021-02-19 NOTE — Progress Notes (Signed)
Subjective:  Patient ID: Timothy Carrillo, male    DOB: 1950-10-29  Age: 71 y.o. MRN: [id number]  CC: The primary encounter diagnosis was Palpitations. Diagnoses of Essential hypertension, History of chest pain at rest, Atypical chest pain, Gastroesophageal reflux disease with esophagitis without hemorrhage, and Chronic frontal sinusitis were also pertinent to this visit.   This visit occurred during the SARS-CoV-2 public health emergency.  Safety protocols were in place, including screening questions prior to the visit, additional usage of staff PPE, and extensive cleaning of exam room while observing appropriate contact time as indicated for disinfecting solutions.    HPI Timothy Carrillo presents for follow up on recent episode of chest pain that occurred at rest. Patient refused to go to ER for evaluation.    HX:  Seen for follow up on Dec 21 ,  BP elevated.  Returned for RN  visit  om Dec 28, BP still elevated  prescribed amlodipine 5 mg daily.  Called on Jan 6 reporting an episode of "chest pain described as a band around his chest that lasted about 24 hours , accompanied by dry cough,  and pleurisy.  Symptoms occurred after taking two days of amlodipine  and he would not heed advice to go to ER b/c  he attributed the pain to the amlodipine . He states that the pain subsided "once the medicine was out of his body."  and he had an RV show to attend.  He has not resumed amlodipine.  Has been exercising regularly since late December and has not had an  past or subsequent occurrence of chest pain    History of COVID in August.  Has had persistent  sinus congestion with intermittent blood  streaked rhinitis since then   History of tobacco abuse x 45 years,  quit 16 yrs ago.    Outpatient Medications Prior to Visit  Medication Sig Dispense Refill   sildenafil (REVATIO) 20 MG tablet TAKE 1 TABLET(20 MG) BY MOUTH THREE TIMES DAILY 90 tablet 5   traMADol (ULTRAM) 50 MG tablet Take 2  tablets (100 mg total) by mouth daily as needed. 60 tablet 5   omeprazole (PRILOSEC) 20 MG capsule Take 20 mg by mouth daily as needed.     amLODipine (NORVASC) 5 MG tablet Take 1 tablet (5 mg total) by mouth daily. (Patient not taking: Reported on 02/19/2021) 90 tablet 1   No facility-administered medications prior to visit.    Review of Systems;  Patient denies headache, fevers, malaise, unintentional weight loss, skin rash, eye pain, sinus congestion and sinus pain, sore throat, dysphagia,  hemoptysis , cough, dyspnea, wheezing, chest pain, palpitations, orthopnea, edema, abdominal pain, nausea, melena, diarrhea, constipation, flank pain, dysuria, hematuria, urinary  Frequency, nocturia, numbness, tingling, seizures,  Focal weakness, Loss of consciousness,  Tremor, insomnia, depression, anxiety, and suicidal ideation.      Objective:  BP (!) 144/86 (BP Location: Left Arm, Patient Position: Sitting, Cuff Size: Normal)    Pulse 62    Temp 97.7 F (36.5 C) (Oral)    Ht 5\' 9"  (1.753 m)    Wt 163 lb 12.8 oz (74.3 kg)    SpO2 97%    BMI 24.19 kg/m   BP Readings from Last 3 Encounters:  02/19/21 (!) 144/86  02/04/21 (!) 150/86  01/28/21 (!) 152/90    Wt Readings from Last 3 Encounters:  02/19/21 163 lb 12.8 oz (74.3 kg)  01/28/21 159 lb 12.8 oz (72.5 kg)  04/17/20 164 lb  6.4 oz (74.6 kg)    General appearance: alert, cooperative and appears stated age Ears: normal TM's and external ear canals both ears Throat: lips, mucosa, and tongue normal; teeth and gums normal Neck: no adenopathy, no carotid bruit, supple, symmetrical, trachea midline and thyroid not enlarged, symmetric, no tenderness/mass/nodules Back: symmetric, no curvature. ROM normal. No CVA tenderness. Lungs: clear to auscultation bilaterally Heart: regular rate and rhythm, S1, S2 normal, no murmur, click, rub or gallop Abdomen: soft, non-tender; bowel sounds normal; no masses,  no organomegaly Pulses: 2+ and  symmetric Skin: Skin color, texture, turgor normal. No rashes or lesions Lymph nodes: Cervical, supraclavicular, and axillary nodes normal.  No results found for: HGBA1C  Lab Results  Component Value Date   CREATININE 0.89 04/17/2020   CREATININE 0.86 04/16/2019   CREATININE 0.96 04/12/2018    Lab Results  Component Value Date   WBC 6.3 04/17/2020   HGB 14.6 04/17/2020   HCT 42.8 04/17/2020   PLT 323.0 04/17/2020   GLUCOSE 89 04/17/2020   CHOL 171 04/17/2020   TRIG 63.0 04/17/2020   HDL 66.70 04/17/2020   LDLDIRECT 102.0 03/31/2016   LDLCALC 92 04/17/2020   ALT 10 04/17/2020   AST 14 04/17/2020   NA 140 04/17/2020   K 4.5 04/17/2020   CL 103 04/17/2020   CREATININE 0.89 04/17/2020   BUN 12 04/17/2020   CO2 32 04/17/2020   TSH 1.93 04/17/2020   PSA 3.57 04/17/2020   MICROALBUR 1.3 04/17/2020    No results found.  Assessment & Plan:   Problem List Items Addressed This Visit     Reflux esophagitis    Advised to resume omeprazole       Atypical chest pain    I have ordered and reviewed a 12 lead EKG and find that there are no acute changes and patient is in sinus rhythm.    He has declined cardiology referral . Chest x ray Is normal/   He attributes the chest pain to amlodipine and is willing to start another medication.  Will resume treatment for GERD       Chronic sinusitis    Given chronicity of symptoms, development of facial pain and exam consistent with bacterial URI,  Will treat with empiric antibiotics x 10 days , decongestants, and saline lavage.       Other Visit Diagnoses     Palpitations    -  Primary   Relevant Orders   EKG 12-Lead (Completed)   Essential hypertension       Relevant Medications   telmisartan (MICARDIS) 40 MG tablet   Other Relevant Orders   Basic metabolic panel   Lipid panel   History of chest pain at rest       Relevant Orders   DG Chest 2 View (Completed)       I have discontinued Kadarrius E. Hazzard's  amLODipine. I have also changed his omeprazole. Additionally, I am having him start on telmisartan. Lastly, I am having him maintain his sildenafil and traMADol.  Meds ordered this encounter  Medications   telmisartan (MICARDIS) 40 MG tablet    Sig: Take 1 tablet (40 mg total) by mouth daily.    Dispense:  30 tablet    Refill:  2   omeprazole (PRILOSEC) 20 MG capsule    Sig: Take 1 capsule (20 mg total) by mouth daily.    Dispense:  90 capsule    Refill:  1     I provided  40  minutes of  face-to-face time during this encounter reviewing patient's current problems and past surgeries, labs and imaging studies, providing counseling on the above mentioned problems , and coordination  of care .   Follow-up: Return in about 1 week (around 02/26/2021).   Crecencio Mc, MD

## 2021-02-19 NOTE — Assessment & Plan Note (Signed)
Given chronicity of symptoms, development of facial pain and exam consistent with bacterial URI,  Will treat with empiric antibiotics x 10 days , decongestants, and saline lavage.

## 2021-03-18 ENCOUNTER — Other Ambulatory Visit: Payer: Self-pay

## 2021-03-18 ENCOUNTER — Encounter: Payer: Self-pay | Admitting: Urology

## 2021-03-18 ENCOUNTER — Ambulatory Visit: Payer: PPO | Admitting: Urology

## 2021-03-18 VITALS — BP 116/72 | HR 76 | Ht 69.0 in | Wt 161.0 lb

## 2021-03-18 DIAGNOSIS — N138 Other obstructive and reflux uropathy: Secondary | ICD-10-CM | POA: Diagnosis not present

## 2021-03-18 DIAGNOSIS — N401 Enlarged prostate with lower urinary tract symptoms: Secondary | ICD-10-CM | POA: Diagnosis not present

## 2021-03-18 DIAGNOSIS — Z125 Encounter for screening for malignant neoplasm of prostate: Secondary | ICD-10-CM | POA: Diagnosis not present

## 2021-03-18 DIAGNOSIS — N529 Male erectile dysfunction, unspecified: Secondary | ICD-10-CM

## 2021-03-18 MED ORDER — TAMSULOSIN HCL 0.4 MG PO CAPS: 0.4000 mg | ORAL_CAPSULE | Freq: Every day | ORAL | 11 refills | Status: DC

## 2021-03-18 NOTE — Patient Instructions (Signed)
Benign Prostatic Hyperplasia Benign prostatic hyperplasia (BPH) is an enlarged prostate gland that is caused by the normal aging process. The prostate may get bigger as a man gets older. The condition is not caused by cancer. The prostate is a walnut-sized gland that is involved in the production of semen. It is located in front of the rectum and below the bladder. The bladder stores urine. The urethra carries stored urine out of the body. An enlarged prostate can press on the urethra. This can make it harder to pass urine. The buildup of urine in the bladder can cause infection. Back pressure and infection may progress to bladder damage and kidney (renal) failure. What are the causes? This condition is part of the normal aging process. However, not all men develop problems from this condition. If the prostate enlarges away from the urethra, urine flow will not be blocked. If it enlarges toward the urethra and compresses it, there will be problems passing urine. What increases the risk? This condition is more likely to develop in men older than 50 years. What are the signs or symptoms? Symptoms of this condition include: Getting up often during the night to urinate. Needing to urinate frequently during the day. Difficulty starting urine flow. Decrease in size and strength of your urine stream. Leaking (dribbling) after urinating. Inability to pass urine. This needs immediate treatment. Inability to completely empty your bladder. Pain when you pass urine. This is more common if there is also an infection. Urinary tract infection (UTI). How is this diagnosed? This condition is diagnosed based on your medical history, a physical exam, and your symptoms. Tests will also be done, such as: A post-void bladder scan. This measures any amount of urine that may remain in your bladder after you finish urinating. A digital rectal exam. In a rectal exam, your health care provider checks your prostate by  putting a lubricated, gloved finger into your rectum to feel the back of your prostate gland. This exam detects the size of your gland and any abnormal lumps or growths. An exam of your urine (urinalysis). A prostate specific antigen (PSA) screening. This is a blood test used to screen for prostate cancer. An ultrasound. This test uses sound waves to electronically produce a picture of your prostate gland. Your health care provider may refer you to a specialist in kidney and prostate diseases (urologist). How is this treated? Once symptoms begin, your health care provider will monitor your condition (active surveillance or watchful waiting). Treatment for this condition will depend on the severity of your condition. Treatment may include: Observation and yearly exams. This may be the only treatment needed if your condition and symptoms are mild. Medicines to relieve your symptoms, including: Medicines to shrink the prostate. Medicines to relax the muscle of the prostate. Surgery in severe cases. Surgery may include: Prostatectomy. In this procedure, the prostate tissue is removed completely through an open incision or with a laparoscope or robotics. Transurethral resection of the prostate (TURP). In this procedure, a tool is inserted through the opening at the tip of the penis (urethra). It is used to cut away tissue of the inner core of the prostate. The pieces are removed through the same opening of the penis. This removes the blockage. Transurethral incision (TUIP). In this procedure, small cuts are made in the prostate. This lessens the prostate's pressure on the urethra. Transurethral microwave thermotherapy (TUMT). This procedure uses microwaves to create heat. The heat destroys and removes a small amount of prostate  tissue. Transurethral needle ablation (TUNA). This procedure uses radio frequencies to destroy and remove a small amount of prostate tissue. Interstitial laser coagulation ([place]).  This procedure uses a laser to destroy and remove a small amount of prostate tissue. Transurethral electrovaporization (TUVP). This procedure uses electrodes to destroy and remove a small amount of prostate tissue. Prostatic urethral lift. This procedure inserts an implant to push the lobes of the prostate away from the urethra. Follow these instructions at home: Take over-the-counter and prescription medicines only as told by your health care provider. Monitor your symptoms for any changes. Contact your health care provider with any changes. Avoid drinking large amounts of liquid before going to bed or out in public. Avoid or reduce how much caffeine or alcohol you drink. Give yourself time when you urinate. Keep all follow-up visits. This is important. Contact a health care provider if: You have unexplained back pain. Your symptoms do not get better with treatment. You develop side effects from the medicine you are taking. Your urine becomes very dark or has a bad smell. Your lower abdomen becomes distended and you have trouble passing urine. Get help right away if: You have a fever or chills. You suddenly cannot urinate. You feel light-headed or very dizzy, or you faint. There are large amounts of blood or clots in your urine. Your urinary problems become hard to manage. You develop moderate to severe low back or flank pain. The flank is the side of your body between the ribs and the hip. These symptoms may be an emergency. Get help right away. Call 911. Do not wait to see if the symptoms will go away. Do not drive yourself to the hospital. Summary Benign prostatic hyperplasia (BPH) is an enlarged prostate that is caused by the normal aging process. It is not caused by cancer. An enlarged prostate can press on the urethra. This can make it hard to pass urine. This condition is more likely to develop in men older than 50 years. Get help right away if you suddenly cannot urinate. This  information is not intended to replace advice given to you by your health care provider. Make sure you discuss any questions you have with your health care provider. Document Revised: 08/13/2020 Document Reviewed: 08/13/2020 Elsevier Patient Education  2022 [place].  Prostate Cancer Screening Prostate cancer screening is testing that is done to check for the presence of prostate cancer in men. The prostate gland is a walnut-sized gland that is located below the bladder and in front of the rectum in males. The function of the prostate is to add fluid to semen during ejaculation. Prostate cancer is one of the most common types of cancer in men. Who should have prostate cancer screening? Screening recommendations vary based on age and other risk factors, as well as between the professional organizations who make the recommendations. In general, screening is recommended if: You are age 62 to 63 and have an average risk for prostate cancer. You should talk with your health care provider about your need for screening and how often screening should be done. Because most prostate cancers are slow growing and will not cause death, screening in this age group is generally reserved for men who have a 67- to 15-year life expectancy. You are younger than age 15, and you have these risk factors: Having a father, brother, or uncle who has been diagnosed with prostate cancer. The risk is higher if your family member's cancer occurred at an early age  or if you have multiple family members with prostate cancer at an early age. Being a male who is [place] or is of [place] or sub-Saharan African descent. In general, screening is not recommended if: You are younger than age 35. You are between the ages of 61 and 31 and you have no risk factors. You are 67 years of age or older. At this age, the risks that screening can cause are greater than the benefits that it may provide. If you are at high risk for prostate  cancer, your health care provider may recommend that you have screenings more often or that you start screening at a younger age. How is screening for prostate cancer done? The recommended prostate cancer screening test is a blood test called the prostate-specific antigen (PSA) test. PSA is a protein that is made in the prostate. As you age, your prostate naturally produces more PSA. Abnormally high PSA levels may be caused by: Prostate cancer. An enlarged prostate that is not caused by cancer (benign prostatic hyperplasia, or BPH). This condition is very common in older men. A prostate gland infection (prostatitis) or urinary tract infection. Certain medicines such as male hormones (like testosterone) or other medicines that raise testosterone levels. A rectal exam may be done as part of prostate cancer screening to help provide information about the size of your prostate gland. When a rectal exam is performed, it should be done after the PSA level is drawn to avoid any effect on the results. Depending on the PSA results, you may need more tests, such as: A physical exam to check the size of your prostate gland, if not done as part of screening. Blood and imaging tests. A procedure to remove tissue samples from your prostate gland for testing (biopsy). This is the only way to know for certain if you have prostate cancer. What are the benefits of prostate cancer screening? Screening can help to identify cancer at an early stage, before symptoms start and when the cancer can be treated more easily. There is a small chance that screening may lower your risk of dying from prostate cancer. The chance is small because prostate cancer is a slow-growing cancer, and most men with prostate cancer die from a different cause. What are the risks of prostate cancer screening? The main risk of prostate cancer screening is diagnosing and treating prostate cancer that would never have caused any symptoms or  problems. This is called overdiagnosisand overtreatment. PSA screening cannot tell you if your PSA is high due to cancer or a different cause. A prostate biopsy is the only procedure to diagnose prostate cancer. Even the results of a biopsy may not tell you if your cancer needs to be treated. Slow-growing prostate cancer may not need any treatment other than monitoring, so diagnosing and treating it may cause unnecessary stress or other side effects. Questions to ask your health care provider When should I start prostate cancer screening? What is my risk for prostate cancer? How often do I need screening? What type of screening tests do I need? How do I get my test results? What do my results mean? Do I need treatment? Where to find more information The American Cancer Society: www.cancer.org American Urological Association: www.auanet.org Contact a health care provider if: You have difficulty urinating. You have pain when you urinate or ejaculate. You have blood in your urine or semen. You have pain in your back or in the area of your prostate. Summary Prostate cancer is a  common type of cancer in men. The prostate gland is located below the bladder and in front of the rectum. This gland adds fluid to semen during ejaculation. Prostate cancer screening may identify cancer at an early stage, when the cancer can be treated more easily and is less likely to have spread to other areas of the body. The prostate-specific antigen (PSA) test is the recommended screening test for prostate cancer, but it has associated risks. Discuss the risks and benefits of prostate cancer screening with your health care provider. If you are age 25 or older, the risks that screening can cause are greater than the benefits that it may provide. This information is not intended to replace advice given to you by your health care provider. Make sure you discuss any questions you have with your health care  provider. Document Revised: 07/21/2020 Document Reviewed: 07/21/2020 Elsevier Patient Education  [place].

## 2021-03-18 NOTE — Progress Notes (Signed)
° °  03/18/21 10:16 AM   Timothy Carrillo 1950/06/21 [id number]  CC: Prostate nodule on MRI(2013), nocturia, ED, PSA screening  HPI: Timothy Carrillo was referred from PCP for a distant history of a prostate nodule that was originally seen on hip MRI all the way back in 2013.  Those images are not available to personally review, but per report there is a 1.6 cm small nodule in the central portion of the prostate.  He denies significant urinary symptoms during the day, but can have nocturia 2-6 times overnight.  He denies significant fluid intake before bed, or lower extremity edema.  He denies any dysuria or gross hematuria, UA with PCP was benign.  PSA has been normal, most recently 3.57 in March 2022, which was essentially stable over the last 5 years, including 3.45 5 years ago.  He denies any prior prostate biopsy or urology evaluation.  He takes sildenafil with good results for ED.   PMH: Past Medical History:  Diagnosis Date   Allergy    Arthritis    Chicken pox    Migraines    Substance abuse ([place]) 2006   40 pk year tobacco     Surgical History: Past Surgical History:  Procedure Laterality Date   APPENDECTOMY  1965   CATARACT EXTRACTION, BILATERAL Bilateral 2012   2013   COLONOSCOPY WITH PROPOFOL N/A 09/03/2016   Procedure: COLONOSCOPY WITH PROPOFOL;  Surgeon: Manya Silvas, MD;  Location: [facility] ENDOSCOPY;  Service: Endoscopy;  Laterality: N/A;   HERNIA REPAIR Right 2011   HERNIA REPAIR Right 2012   redo of mesh , Tamala Julian   HIP SURGERY Right 2013     Family History: Family History  Problem Relation Age of Onset   Stroke Mother    Cancer Father        leukemia    Social History:  reports that he quit smoking about 16 years ago. His smoking use included cigarettes. He has never used smokeless tobacco. He reports current alcohol use of about 10.0 standard drinks per week. He reports that he does not use drugs.  Physical Exam: BP 116/72    Pulse 76    Ht 5\' 9"  (1.753  m)    Wt 161 lb (73 kg)    BMI 23.78 kg/m    Constitutional:  Alert and oriented, No acute distress. Cardiovascular: No clubbing, cyanosis, or edema. Respiratory: Normal respiratory effort, no increased work of breathing. GI: Abdomen is soft, nontender, nondistended, no abdominal masses DRE: 40 g, prominent sulcus, smooth, no nodules or masses  Laboratory Data: Reviewed, see HPI  Assessment & Plan:   71 year old male referred for a central 1.6 cm prostate nodule seen on hip MRI from all the way back in 2013, that likely represented a benign BPH nodule.  Those images are not available to personally review, and PSA has remained within the normal range, and DRE is benign today.  We reviewed the AUA guidelines that do not recommend routine screening in men over age 65.  In terms of his nocturia, we reviewed behavioral strategies, and he is also interested in a trial of Flomax nightly.  Risk and benefits discussed.  We also discussed considering Cialis in the future if he has ED refractory to sildenafil.  Trial of Flomax 0.4 mg nightly No further PSA screening per AUA guidelines RTC 3 months IPSS and PVR  Timothy Madrid, MD 03/18/2021  [facility] [address] [place], [place] [address] [phone]

## 2021-03-24 ENCOUNTER — Telehealth: Payer: Self-pay | Admitting: Internal Medicine

## 2021-03-24 NOTE — Telephone Encounter (Signed)
Patient called and said that Dr Derrel Nip gave him antibiotics a week ago. His symptoms went away but they came back after he finished his antibiotics. He is requesting more antibiotics.

## 2021-03-24 NOTE — Telephone Encounter (Signed)
LMTCB

## 2021-03-26 NOTE — Telephone Encounter (Signed)
Spoke with pt and to let him know that he will need to be seen in person. Pt was advised to go to urgent care for evaluation since there in no availability at our office. Pt gave a verbal understanding.

## 2021-03-26 NOTE — Telephone Encounter (Signed)
Pt was seen on 02/19/2021 and prescribed augmentin for a sinus infection. Pt stated that he got better while on the antibiotics but they symptoms returned once completed. Pt stated that he is so congested that he can not push air in or out through his nose. He also stated that the congestion has now moved into his chest and is having some SOBr but not sure if its due to being so anxious because he can't breath in and out of his nose. Pt stated that he is claustrophobic and not being able to breath out of nose is making him feel very claustrophobic. Pt stated that he has been using a q-tip to put vicks vapor rub inside his nostrils to see if he can open them up but it is not working. Pt is wanting to know if something else can be called in.

## 2021-03-27 ENCOUNTER — Ambulatory Visit
Admission: RE | Admit: 2021-03-27 | Discharge: 2021-03-27 | Disposition: A | Discharge status: Home / Self Care | Payer: PPO | Source: Ambulatory Visit | Attending: Family Medicine | Admitting: Family Medicine

## 2021-03-27 ENCOUNTER — Ambulatory Visit (INDEPENDENT_AMBULATORY_CARE_PROVIDER_SITE_OTHER): Payer: PPO

## 2021-03-27 ENCOUNTER — Other Ambulatory Visit: Payer: Self-pay

## 2021-03-27 VITALS — BP 133/79 | HR 80 | Temp 98.1°F | Resp 18

## 2021-03-27 DIAGNOSIS — J329 Chronic sinusitis, unspecified: Secondary | ICD-10-CM

## 2021-03-27 DIAGNOSIS — R0602 Shortness of breath: Secondary | ICD-10-CM

## 2021-03-27 DIAGNOSIS — R059 Cough, unspecified: Secondary | ICD-10-CM

## 2021-03-27 DIAGNOSIS — R052 Subacute cough: Secondary | ICD-10-CM

## 2021-03-27 MED ORDER — DOXYCYCLINE HYCLATE 100 MG PO CAPS: 100.0000 mg | ORAL_CAPSULE | Freq: Two times a day (BID) | ORAL | 0 refills | Status: DC

## 2021-03-27 MED ORDER — DEXAMETHASONE SODIUM PHOSPHATE 10 MG/ML IJ SOLN
10.0000 mg | Freq: Once | INTRAMUSCULAR | Status: AC
  Administered 2021-03-27: 10 mg via INTRAMUSCULAR

## 2021-03-27 MED ORDER — PREDNISONE 20 MG PO TABS: 20.0000 mg | ORAL_TABLET | Freq: Every day | ORAL | 0 refills | Status: AC

## 2021-03-27 NOTE — ED Triage Notes (Signed)
Pt here with cough, nasal congestion for over a week. Was put on abx and then once finished, his sx came back.

## 2021-03-27 NOTE — ED Provider Notes (Addendum)
Timothy Carrillo    CSN: [id number] Arrival date & time: 03/27/21  0848      History   Chief Complaint Chief Complaint  Patient presents with   Nasal Congestion   Cough    HPI Timothy Carrillo is a 71 y.o. male.   HPI Patient presents today with a 5-week history of recurrent sinusitis.  Patient was seen by his PCP on January 12 and treated with a 10-day course of Augmentin patient reports symptoms never resolved and has persistently worsened over the last week.  He reports being unable to breathe through his nose.  He has used multiple over-the-counter nasal sprays and cough and congestion medications without any relief.  He denies any wheezing or chest tightness.  He reports he is short of breath as he is unable to breathe through his nose.  He has been fever free and denies any known sick exposures. Past Medical History:  Diagnosis Date   Allergy    Arthritis    Chicken pox    Migraines    Substance abuse ([place]) 2006   40 pk year tobacco     Patient Active Problem List   Diagnosis Date Noted   Chronic sinusitis of both maxillary sinuses 02/19/2021   Atypical chest pain 02/19/2021   Chronic sinusitis 02/19/2021   Chronic right hip pain 01/30/2021   Low back pain 01/30/2021   Prostate nodule 01/28/2021   Impingement syndrome of right shoulder 11/22/2019   Erectile dysfunction 04/16/2019   Elevated blood pressure reading without diagnosis of hypertension 04/04/2016   Hyperlipidemia 04/04/2016   Fatigue 03/15/2015   Encounter for general adult medical examination with abnormal findings 03/15/2015   BPH associated with nocturia 03/15/2015   Personal history of colonic polyps 11/07/2012   GERD (gastroesophageal reflux disease) 11/07/2012   Prostate cancer screening 09/11/2012   Reflux esophagitis 09/01/2012   Arthritis     Past Surgical History:  Procedure Laterality Date   APPENDECTOMY  1965   CATARACT EXTRACTION, BILATERAL Bilateral 2012   2013    COLONOSCOPY WITH PROPOFOL N/A 09/03/2016   Procedure: COLONOSCOPY WITH PROPOFOL;  Surgeon: Manya Silvas, MD;  Location: [facility] ENDOSCOPY;  Service: Endoscopy;  Laterality: N/A;   HERNIA REPAIR Right 2011   HERNIA REPAIR Right 2012   redo of mesh , Tamala Julian   HIP SURGERY Right 2013       Home Medications    Prior to Admission medications   Medication Sig Start Date End Date Taking? Authorizing Provider  doxycycline (VIBRAMYCIN) 100 MG capsule Take 1 capsule (100 mg total) by mouth 2 (two) times daily. 03/27/21  Yes Scot Jun, FNP  predniSONE (DELTASONE) 20 MG tablet Take 1 tablet (20 mg total) by mouth daily with breakfast for 5 days. 03/28/21 04/02/21 Yes Scot Jun, FNP  omeprazole (PRILOSEC) 20 MG capsule Take 1 capsule (20 mg total) by mouth daily. 02/19/21   Crecencio Mc, MD  sildenafil (REVATIO) 20 MG tablet TAKE 1 TABLET(20 MG) BY MOUTH THREE TIMES DAILY 12/18/19   Crecencio Mc, MD  tamsulosin (FLOMAX) 0.4 MG CAPS capsule Take 1 capsule (0.4 mg total) by mouth daily. 03/18/21   Billey Co, MD  telmisartan (MICARDIS) 40 MG tablet Take 1 tablet (40 mg total) by mouth daily. 02/19/21   Crecencio Mc, MD  traMADol (ULTRAM) 50 MG tablet Take 2 tablets (100 mg total) by mouth daily as needed. 04/17/20   Crecencio Mc, MD    Family History  Family History  Problem Relation Age of Onset   Stroke Mother    Cancer Father        leukemia    Social History Social History   Tobacco Use   Smoking status: Former    Types: Cigarettes    Quit date: 05/08/2004    Years since quitting: 16.8   Smokeless tobacco: Never  Substance Use Topics   Alcohol use: Yes    Alcohol/week: 10.0 standard drinks    Types: 10 Standard drinks or equivalent per week    Comment: occasionally   Drug use: No     Allergies   Amlodipine   Review of Systems Review of Systems Pertinent negatives listed in HPI  Physical Exam Triage Vital Signs ED Triage Vitals  Enc Vitals Group      BP 03/27/21 0907 133/79     Pulse Rate 03/27/21 0907 80     Resp 03/27/21 0907 18     Temp 03/27/21 0907 98.1 F (36.7 C)     Temp Source 03/27/21 0907 Oral     SpO2 03/27/21 0907 93 %     Weight --      Height --      Head Circumference --      Peak Flow --      Pain Score 03/27/21 0916 0     Pain Loc --      Pain Edu? --      Excl. in [place]? --    No data found.  Updated Vital Signs BP 133/79 (BP Location: Left Arm)    Pulse 80    Temp 98.1 F (36.7 C) (Oral)    Resp 18    SpO2 93%   Visual Acuity Right Eye Distance:   Left Eye Distance:   Bilateral Distance:    Right Eye Near:   Left Eye Near:    Bilateral Near:     Physical Exam  General Appearance:    Alert, cooperative, no distress  HENT:   Normocephalic, ears normal, nares mucosal edema with congestion, rhinorrhea, oropharynx patent without erythema or exudate  Eyes:    PERRL, conjunctiva/corneas clear, EOM's intact       Lungs:     Clear to auscultation bilaterally, respirations unlabored  Heart:    Regular rate and rhythm  Neurologic:   Awake, alert, oriented x 3. No apparent focal neurological           defect.        UC Treatments / Results  Labs (all labs ordered are listed, but only abnormal results are displayed) Labs Reviewed - No data to display  EKG   Radiology DG Chest 2 View  Result Date: 03/27/2021 CLINICAL DATA:  Shortness of breath, cough. EXAM: CHEST - 2 VIEW COMPARISON:  February 19, 2021. FINDINGS: The heart size and mediastinal contours are within normal limits. Both lungs are clear. The visualized skeletal structures are unremarkable. IMPRESSION: No active cardiopulmonary disease. Electronically Signed   By: Marijo Conception M.D.   On: 03/27/2021 09:34    Procedures Procedures (including critical care time)  Medications Ordered in UC Medications  dexamethasone (DECADRON) injection 10 mg (has no administration in time range)    Initial Impression / Assessment and Plan / UC  Course  I have reviewed the triage vital signs and the nursing notes.  Pertinent labs & imaging results that were available during my care of the patient were reviewed by me and considered in my medical decision making (  see chart for details).    Recurrent sinusitis, shortness of breath subacute cough Chest x-ray is negative. Treating with doxycycline 100 mg twice daily x10 days.  Decadron IM given here in clinic today.  Patient will continue steroid treatment tomorrow with prednisone 20 mg once daily for total 5 days.  Patient advised to return if symptoms worsen or do not readily improve. Final Clinical Impressions(s) / UC Diagnoses   Final diagnoses:  Recurrent sinusitis  Shortness of breath  Subacute cough     Discharge Instructions      You received a steroid injection while here in clinic.  Start the pill form of the steroids tomorrow.  Take in the morning with breakfast to avoid restlessness.  Start doxycycline today take 1 tablet twice daily with food for total of 10 days for sinus infection. If any your symptoms worsen or do not improve with treatment return for evaluation or follow-up with PCP.   ED Prescriptions     Medication Sig Dispense Auth. Provider   doxycycline (VIBRAMYCIN) 100 MG capsule Take 1 capsule (100 mg total) by mouth 2 (two) times daily. 20 capsule Scot Jun, FNP   predniSONE (DELTASONE) 20 MG tablet Take 1 tablet (20 mg total) by mouth daily with breakfast for 5 days. 5 tablet Scot Jun, FNP      PDMP not reviewed this encounter.   Scot Jun, FNP 03/27/21 [place], [place], FNP 03/27/21 1055

## 2021-03-27 NOTE — Discharge Instructions (Signed)
You received a steroid injection while here in clinic.  Start the pill form of the steroids tomorrow.  Take in the morning with breakfast to avoid restlessness.  Start doxycycline today take 1 tablet twice daily with food for total of 10 days for sinus infection. If any your symptoms worsen or do not improve with treatment return for evaluation or follow-up with PCP.

## 2021-04-20 ENCOUNTER — Encounter: Payer: PPO | Admitting: Internal Medicine

## 2021-04-21 ENCOUNTER — Encounter: Payer: PPO | Admitting: Internal Medicine

## 2021-04-24 ENCOUNTER — Telehealth: Payer: Self-pay | Admitting: Internal Medicine

## 2021-04-24 ENCOUNTER — Other Ambulatory Visit: Payer: Self-pay

## 2021-04-24 ENCOUNTER — Encounter: Payer: Self-pay | Admitting: Internal Medicine

## 2021-04-24 ENCOUNTER — Ambulatory Visit (INDEPENDENT_AMBULATORY_CARE_PROVIDER_SITE_OTHER): Payer: PPO | Admitting: Internal Medicine

## 2021-04-24 VITALS — BP 158/88 | HR 61 | Temp 97.5°F | Ht 69.0 in | Wt 163.2 lb

## 2021-04-24 DIAGNOSIS — R5383 Other fatigue: Secondary | ICD-10-CM | POA: Diagnosis not present

## 2021-04-24 DIAGNOSIS — Z86010 Personal history of colonic polyps: Secondary | ICD-10-CM

## 2021-04-24 DIAGNOSIS — E7800 Pure hypercholesterolemia, unspecified: Secondary | ICD-10-CM

## 2021-04-24 DIAGNOSIS — M25551 Pain in right hip: Secondary | ICD-10-CM | POA: Diagnosis not present

## 2021-04-24 DIAGNOSIS — G8929 Other chronic pain: Secondary | ICD-10-CM | POA: Diagnosis not present

## 2021-04-24 DIAGNOSIS — R7301 Impaired fasting glucose: Secondary | ICD-10-CM | POA: Diagnosis not present

## 2021-04-24 DIAGNOSIS — Z125 Encounter for screening for malignant neoplasm of prostate: Secondary | ICD-10-CM

## 2021-04-24 DIAGNOSIS — I10 Essential (primary) hypertension: Secondary | ICD-10-CM

## 2021-04-24 MED ORDER — TELMISARTAN 40 MG PO TABS: 40.0000 mg | ORAL_TABLET | Freq: Every day | ORAL | 0 refills | Status: DC

## 2021-04-24 NOTE — Assessment & Plan Note (Addendum)
Reviewed his prior workup ten years ago.  He refuses to see orthopedics.  Continue tramadol for pain management .  He refuses orthopedic evaluation as this has been done in the past without success if relieving pain  ?

## 2021-04-24 NOTE — Progress Notes (Signed)
Patient ID: Timothy Carrillo, male    DOB: 02-23-1950  Age: 71 y.o. MRN: [id number]  The patient is here for follow up and management of other chronic and acute problems.  This visit occurred during the SARS-CoV-2 public health emergency.  Safety protocols were in place, including screening questions prior to the visit, additional usage of staff PPE, and extensive cleaning of exam room while observing appropriate contact time as indicated for disinfecting solutions.     The risk factors are reflected in the social history.  The roster of all physicians providing medical care to patient - is listed in the Snapshot section of the chart.  Activities of daily living:  The patient is 100% independent in all ADLs: dressing, toileting, feeding as well as independent mobility  Home safety : The patient has smoke detectors in the home. They wear seatbelts.  There are no unsecured firearms at home. There is no violence in the home.   There is no risks for hepatitis, STDs or HIV. There is no   history of blood transfusion. They have no travel history to infectious disease endemic areas of the world.  The patient has seen their dentist in the last six month. They have seen their eye doctor in the last year. They admit to slight hearing difficulty with regard to whispered voices and some television programs.  They have deferred audiologic testing in the last year.  They do not  have excessive sun exposure. Discussed the need for sun protection: hats, long sleeves and use of sunscreen if there is significant sun exposure.   Diet: the importance of a healthy diet is discussed. They do have a healthy diet.  The benefits of regular aerobic exercise were discussed. Timothy Carrillo golfs orwalks 4 times per week  Depression screen: there are no signs or vegative symptoms of depression- irritability, change in appetite, anhedonia, sadness/tearfullness.  Cognitive assessment: the patient manages all their financial and  personal affairs and is actively engaged. They could relate day,date,year and events; recalled 2/3 objects at 3 minutes; performed clock-face test normally.  The following portions of the patient's history were reviewed and updated as appropriate: allergies, current medications, past family history, past medical history,  past surgical history, past social history  and problem list.  Visual acuity was not assessed per patient preference since she has regular follow up with her ophthalmologist. Hearing and body mass index were assessed and reviewed.   During the course of the visit the patient was educated and counseled about appropriate screening and preventive services including : fall prevention , diabetes screening, nutrition counseling, colorectal cancer screening, and recommended immunizations.    CC: The primary encounter diagnosis was Prostate cancer screening. Diagnoses of Other fatigue, Pure hypercholesterolemia, Essential hypertension, Impaired fasting glucose, Chronic right hip pain, and Personal history of colonic polyps were also pertinent to this visit.   1) sinusitis :  treated by Urgent Care for  bronchitis and sinusitis that failed to improve with Augmentin.   States that the sinus congestion resolved 30 minutes after receiving an IM dose of Decadron .  Finished the doxycycline and prednisone taper   Continues to have thick sinus drainage ever Since COVID infection last August and had been putting Vick's vapo rub up his nose.   2) chronic pain:  hip and back felt much better while on steroids.  Using tramadol  prn . Wants to stay on steroids   3) HTN:  home readings  116/67 and all are < 130/80.  Taking telmisarta    History Timothy Carrillo has a past medical history of Allergy, Arthritis, Chicken pox, Migraines, and Substance abuse (HCC) (2006).   Timothy Carrillo has a past surgical history that includes Appendectomy (1965); Hip surgery (Right, 2013); Hernia repair (Right, 2011); Hernia repair  (Right, 2012); Colonoscopy with propofol (N/A, 09/03/2016); and Cataract extraction, bilateral (Bilateral, 2012).   His family history includes Cancer in his father; Stroke in his mother.Timothy Carrillo reports that Timothy Carrillo quit smoking about 16 years ago. His smoking use included cigarettes. Timothy Carrillo has never used smokeless tobacco. Timothy Carrillo reports current alcohol use of about 10.0 standard drinks per week. Timothy Carrillo reports that Timothy Carrillo does not use drugs.  Outpatient Medications Prior to Visit  Medication Sig Dispense Refill   omeprazole (PRILOSEC) 20 MG capsule Take 1 capsule (20 mg total) by mouth daily. 90 capsule 1   sildenafil (REVATIO) 20 MG tablet TAKE 1 TABLET(20 MG) BY MOUTH THREE TIMES DAILY 90 tablet 5   tamsulosin (FLOMAX) 0.4 MG CAPS capsule Take 1 capsule (0.4 mg total) by mouth daily. 30 capsule 11   traMADol (ULTRAM) 50 MG tablet Take 2 tablets (100 mg total) by mouth daily as needed. 60 tablet 5   telmisartan (MICARDIS) 40 MG tablet Take 1 tablet (40 mg total) by mouth daily. 30 tablet 2   doxycycline (VIBRAMYCIN) 100 MG capsule Take 1 capsule (100 mg total) by mouth 2 (two) times daily. (Patient not taking: Reported on 04/24/2021) 20 capsule 0   No facility-administered medications prior to visit.    Review of Systems  Patient denies headache, fevers, malaise, unintentional weight loss, skin rash, eye pain, sinus congestion and sinus pain, sore throat, dysphagia,  hemoptysis , cough, dyspnea, wheezing, chest pain, palpitations, orthopnea, edema, abdominal pain, nausea, melena, diarrhea, constipation, flank pain, dysuria, hematuria, urinary  Frequency, nocturia, numbness, tingling, seizures,  Focal weakness, Loss of consciousness,  Tremor, insomnia, depression, anxiety, and suicidal ideation.     Objective:  BP (!) 158/88 (BP Location: Left Arm, Patient Position: Sitting, Cuff Size: Normal)   Pulse 61   Temp (!) 97.5 F (36.4 C) (Oral)   Ht 5\' 9"  (1.753 m)   Wt 163 lb 3.2 oz (74 kg)   SpO2 99%   BMI 24.10  kg/m   Physical Exam   General appearance: alert, cooperative and appears stated age Ears: normal TM's and external ear canals both ears Throat: lips, mucosa, and tongue normal; teeth and gums normal Neck: no adenopathy, no carotid bruit, supple, symmetrical, trachea midline and thyroid not enlarged, symmetric, no tenderness/mass/nodules Back: symmetric, no curvature. ROM normal. No CVA tenderness. Lungs: clear to auscultation bilaterally Heart: regular rate and rhythm, S1, S2 normal, no murmur, click, rub or gallop Abdomen: soft, non-tender; bowel sounds normal; no masses,  no organomegaly Pulses: 2+ and symmetric Skin: Skin color, texture, turgor normal. No rashes or lesions Lymph nodes: Cervical, supraclavicular, and axillary nodes normal.  Assessment & Plan:   Problem List Items Addressed This Visit     Prostate cancer screening - Primary    PSAs have been normal and Timothy Carrillo was evaluated by Urology recently given history of prostate nodule noted on prior MRI .  No further screening by Urology      Personal history of colonic polyps    Tubular adenoma per 2018 colonoscopy bu Dr Mechele Collin.  Timothy Carrillo declines referral today for follow up       Fatigue   Essential hypertension    Taking telmisartan 40 mg daily with home readings consistently <  130/80.   Will refill telmisartan for 30 days but no longer after that without BMET.  Prior trial of amlodipine not tolerated due to cc of chest pain       Relevant Medications   telmisartan (MICARDIS) 40 MG tablet   Other Relevant Orders   Comprehensive metabolic panel   Hyperlipidemia   Relevant Medications   telmisartan (MICARDIS) 40 MG tablet   Chronic right hip pain    Reviewed his prior workup ten years ago.  Timothy Carrillo refuses to see orthopedics.  Continue tramadol for pain management .  Timothy Carrillo refuses orthopedic evaluation as this has been done in the past without success if relieving pain       Other Visit Diagnoses     Impaired fasting glucose            I have discontinued Georgi E. Batson's doxycycline. I am also having him maintain his sildenafil, traMADol, omeprazole, tamsulosin, and telmisartan.  Meds ordered this encounter  Medications   telmisartan (MICARDIS) 40 MG tablet    Sig: Take 1 tablet (40 mg total) by mouth daily.    Dispense:  30 tablet    Refill:  0    Medications Discontinued During This Encounter  Medication Reason   doxycycline (VIBRAMYCIN) 100 MG capsule    telmisartan (MICARDIS) 40 MG tablet Reorder    Follow-up: No follow-ups on file.   Sherlene Shams, MD

## 2021-04-24 NOTE — Assessment & Plan Note (Signed)
Tubular adenoma per 2018 colonoscopy bu Dr Vira Agar.  He declines referral today for follow up  ?

## 2021-04-24 NOTE — Assessment & Plan Note (Signed)
Taking telmisartan 40 mg daily with home readings consistently < 130/80.   Will refill telmisartan for 30 days but no longer after that without BMET.  Prior trial of amlodipine not tolerated due to cc of chest pain  ?

## 2021-04-24 NOTE — Patient Instructions (Addendum)
Continue mucinex  and salt water irrigation .  ? ?Use the tramadol  2  daily.   Use advil or aleve sparingly as they can cause gastric ulcers if used daily  ? ?Please reconsider your decision to stop screening for colon cancer  ? ?I cannot continue to refill your telmisartan without an assessment of your renal function  ?

## 2021-04-24 NOTE — Assessment & Plan Note (Signed)
PSAs have been normal and he was evaluated by Urology recently given history of prostate nodule noted on prior MRI .  No further screening by Urology ?

## 2021-05-20 ENCOUNTER — Other Ambulatory Visit: Payer: Self-pay | Admitting: Internal Medicine

## 2021-06-17 ENCOUNTER — Ambulatory Visit: Payer: PPO | Admitting: Urology

## 2021-07-29 ENCOUNTER — Telehealth: Payer: Self-pay | Admitting: Internal Medicine

## 2021-07-29 DIAGNOSIS — M1990 Unspecified osteoarthritis, unspecified site: Secondary | ICD-10-CM

## 2021-07-29 MED ORDER — TRAMADOL HCL 50 MG PO TABS: 100.0000 mg | ORAL_TABLET | Freq: Every day | ORAL | 5 refills | Status: DC | PRN

## 2021-07-29 NOTE — Telephone Encounter (Signed)
Pt need refill on tramadol sent to [facility]

## 2021-07-29 NOTE — Telephone Encounter (Signed)
Refilled: 04/17/2020 Last OV: 04/24/2021 Next OV: not scheduled

## 2021-07-31 NOTE — Telephone Encounter (Signed)
Left detailed message letting pt know medication has been refilled

## 2022-01-20 NOTE — Telephone Encounter (Signed)
MyChart messgae sent to patient. 

## 2022-05-20 ENCOUNTER — Ambulatory Visit
Admission: EM | Admit: 2022-05-20 | Discharge: 2022-05-20 | Disposition: A | Discharge status: Home / Self Care | Payer: PPO | Attending: Emergency Medicine | Admitting: Emergency Medicine

## 2022-05-20 DIAGNOSIS — I10 Essential (primary) hypertension: Secondary | ICD-10-CM

## 2022-05-20 DIAGNOSIS — R300 Dysuria: Secondary | ICD-10-CM | POA: Diagnosis not present

## 2022-05-20 DIAGNOSIS — R3129 Other microscopic hematuria: Secondary | ICD-10-CM

## 2022-05-20 LAB — POCT URINALYSIS DIP (MANUAL ENTRY)
Bilirubin, UA: NEGATIVE
Glucose, UA: NEGATIVE mg/dL
Ketones, POC UA: NEGATIVE mg/dL
Leukocytes, UA: NEGATIVE
Nitrite, UA: NEGATIVE
Protein Ur, POC: NEGATIVE mg/dL
Spec Grav, UA: 1.02 (ref 1.010–1.025)
Urobilinogen, UA: 0.2 E.U./dL
pH, UA: 6.5 (ref 5.0–8.0)

## 2022-05-20 NOTE — ED Triage Notes (Signed)
Pt said his back has been hurting, with frequency urination and urine has been cloudy and an odor x 2 days now.

## 2022-05-20 NOTE — Discharge Instructions (Addendum)
Please follow up with your primary care provider for recheck of the blood in your urine.    Your blood pressure is elevated today at 160/88; repeat 152/88.  Please have this rechecked by your primary care provider in 2-4 weeks.

## 2022-05-20 NOTE — ED Provider Notes (Signed)
Timothy Carrillo    CSN: [id number] Arrival date & time: 05/20/22  0813      History   Chief Complaint Chief Complaint  Patient presents with   Urinary Tract Infection    HPI Timothy Carrillo is a 72 y.o. male.  Patient presents with 2 day history of dysuria, urinary frequency, cloudy malodorous urine.  He also reports generalized malaise.  He denies fever, hematuria, abdominal pain, flank pain, penile discharge, or other symptoms.  No treatment at home.  His medical history includes hypertension.  He also reports remote history of possible kidney stone.  The history is provided by the patient and medical records.    Past Medical History:  Diagnosis Date   Allergy    Arthritis    Chicken pox    Migraines    Substance abuse (HCC) 2006   40 pk year tobacco     Patient Active Problem List   Diagnosis Date Noted   Atypical chest pain 02/19/2021   Chronic sinusitis 02/19/2021   Chronic right hip pain 01/30/2021   Low back pain 01/30/2021   Prostate nodule 01/28/2021   Impingement syndrome of right shoulder 11/22/2019   Erectile dysfunction 04/16/2019   Essential hypertension 04/04/2016   Hyperlipidemia 04/04/2016   Fatigue 03/15/2015   BPH associated with nocturia 03/15/2015   Personal history of colonic polyps 11/07/2012   GERD (gastroesophageal reflux disease) 11/07/2012   Prostate cancer screening 09/11/2012   Reflux esophagitis 09/01/2012   Arthritis     Past Surgical History:  Procedure Laterality Date   APPENDECTOMY  1965   CATARACT EXTRACTION, BILATERAL Bilateral 2012   2013   COLONOSCOPY WITH PROPOFOL N/A 09/03/2016   Procedure: COLONOSCOPY WITH PROPOFOL;  Surgeon: Scot Jun, MD;  Location: [facility] ENDOSCOPY;  Service: Endoscopy;  Laterality: N/A;   HERNIA REPAIR Right 2011   HERNIA REPAIR Right 2012   redo of mesh , Katrinka Blazing   HIP SURGERY Right 2013       Home Medications    Prior to Admission medications   Medication Sig Start Date  End Date Taking? Authorizing Provider  omeprazole (PRILOSEC) 20 MG capsule Take 1 capsule (20 mg total) by mouth daily. 02/19/21   Sherlene Shams, MD  sildenafil (REVATIO) 20 MG tablet TAKE 1 TABLET(20 MG) BY MOUTH THREE TIMES DAILY 12/18/19   Sherlene Shams, MD  tamsulosin (FLOMAX) 0.4 MG CAPS capsule Take 1 capsule (0.4 mg total) by mouth daily. 03/18/21   Sondra Come, MD  telmisartan (MICARDIS) 40 MG tablet TAKE 1 TABLET(40 MG) BY MOUTH DAILY 05/20/21   Sherlene Shams, MD  traMADol (ULTRAM) 50 MG tablet Take 2 tablets (100 mg total) by mouth daily as needed. 07/29/21   Sherlene Shams, MD    Family History Family History  Problem Relation Age of Onset   Stroke Mother    Cancer Father        leukemia    Social History Social History   Tobacco Use   Smoking status: Former    Types: Cigarettes    Quit date: 05/08/2004    Years since quitting: 18.0   Smokeless tobacco: Never  Substance Use Topics   Alcohol use: Yes    Alcohol/week: 10.0 standard drinks of alcohol    Types: 10 Standard drinks or equivalent per week    Comment: occasionally   Drug use: No     Allergies   Amlodipine   Review of Systems Review of Systems  Constitutional:  Negative for chills and fever.  Respiratory:  Negative for cough and shortness of breath.   Cardiovascular:  Negative for chest pain and palpitations.  Gastrointestinal:  Negative for abdominal pain, diarrhea, nausea and vomiting.  Genitourinary:  Positive for dysuria and frequency. Negative for flank pain, hematuria and penile discharge.  Skin:  Positive for color change.  All other systems reviewed and are negative.    Physical Exam Triage Vital Signs ED Triage Vitals  Enc Vitals Group     BP      Pulse      Resp      Temp      Temp src      SpO2      Weight      Height      Head Circumference      Peak Flow      Pain Score      Pain Loc      Pain Edu?      Excl. in GC?    No data found.  Updated Vital Signs BP  (!) 152/88 (BP Location: Right Arm)   Pulse 72   Temp 98.6 F (37 C) (Oral)   Resp 16   SpO2 94%   Visual Acuity Right Eye Distance:   Left Eye Distance:   Bilateral Distance:    Right Eye Near:   Left Eye Near:    Bilateral Near:     Physical Exam Vitals and nursing note reviewed.  Constitutional:      General: He is not in acute distress.    Appearance: Normal appearance. He is well-developed. He is not ill-appearing.  HENT:     Mouth/Throat:     Mouth: Mucous membranes are moist.  Cardiovascular:     Rate and Rhythm: Normal rate and regular rhythm.     Heart sounds: Normal heart sounds.  Pulmonary:     Effort: Pulmonary effort is normal. No respiratory distress.     Breath sounds: Normal breath sounds.  Abdominal:     General: Bowel sounds are normal.     Palpations: Abdomen is soft.     Tenderness: There is no abdominal tenderness. There is no right CVA tenderness, left CVA tenderness, guarding or rebound.  Musculoskeletal:     Cervical back: Neck supple.  Skin:    General: Skin is warm and dry.  Neurological:     Mental Status: He is alert.  Psychiatric:        Mood and Affect: Mood normal.        Behavior: Behavior normal.      UC Treatments / Results  Labs (all labs ordered are listed, but only abnormal results are displayed) Labs Reviewed  POCT URINALYSIS DIP (MANUAL ENTRY) - Abnormal; Notable for the following components:      Result Value   Blood, UA trace-intact (*)    All other components within normal limits    EKG   Radiology No results found.  Procedures Procedures (including critical care time)  Medications Ordered in UC Medications - No data to display  Initial Impression / Assessment and Plan / UC Course  I have reviewed the triage vital signs and the nursing notes.  Pertinent labs & imaging results that were available during my care of the patient were reviewed by me and considered in my medical decision making (see chart for  details).   Diarrhea, microscopic hematuria, elevated blood pressure reading with hypertension.  Urine does not indicate infection.  Trace blood.  Patient reports remote history of possible kidney stone.  Discussed increased water intake and ED precautions.  Education provided on dysuria and hematuria.  Instructed patient to follow-up with his PCP for recheck of the hematuria.  Also discussed with patient that his blood pressure is elevated today and needs to be rechecked by PCP.  Education provided on managing hypertension.  He agrees to plan of care.    Final Clinical Impressions(s) / UC Diagnoses   Final diagnoses:  Dysuria  Other microscopic hematuria  Elevated blood pressure reading in office with diagnosis of hypertension     Discharge Instructions      Please follow up with your primary care provider for recheck of the blood in your urine.    Your blood pressure is elevated today at 160/88; repeat 152/88.  Please have this rechecked by your primary care provider in 2-4 weeks.          ED Prescriptions   None    PDMP not reviewed this encounter.   Mickie Bailate, Ardena Gangl H, NP 05/20/22 [phone]

## 2022-08-11 ENCOUNTER — Ambulatory Visit (INDEPENDENT_AMBULATORY_CARE_PROVIDER_SITE_OTHER): Payer: PPO

## 2022-08-11 ENCOUNTER — Ambulatory Visit
Admission: EM | Admit: 2022-08-11 | Discharge: 2022-08-11 | Disposition: A | Discharge status: Home / Self Care | Payer: PPO | Attending: Emergency Medicine | Admitting: Emergency Medicine

## 2022-08-11 DIAGNOSIS — R0781 Pleurodynia: Secondary | ICD-10-CM | POA: Diagnosis not present

## 2022-08-11 DIAGNOSIS — S20212A Contusion of left front wall of thorax, initial encounter: Secondary | ICD-10-CM

## 2022-08-11 MED ORDER — CYCLOBENZAPRINE HCL 10 MG PO TABS: 10.0000 mg | ORAL_TABLET | Freq: Two times a day (BID) | ORAL | 0 refills | Status: AC | PRN

## 2022-08-11 NOTE — ED Triage Notes (Signed)
Patient to Urgent Care with complaints of right sided hip and rib pain following a fall two weeks ago. States [place] and he felt like his hip gave out on him.   Reports playing golf again several days ago and felt his back seize up and had worsening posterior rib pain. Has some relief from hot showers.

## 2022-08-11 NOTE — ED Provider Notes (Signed)
Timothy Carrillo    CSN: [id number] Arrival date & time: 08/11/22  0947      History   Chief Complaint Chief Complaint  Patient presents with   Fall    HPI Timothy Carrillo is a 72 y.o. male.  Presents with right rib pain x 2 weeks.  The pain started after he stepped in a hole while playing golf and fell.  The pain worsened while playing golf a few days ago.  It improves with hot showers.  No wounds, bruising, redness, swelling, shortness of breath, chest pain, abdominal pain, or other symptoms.  The history is provided by the patient and medical records.    Past Medical History:  Diagnosis Date   Allergy    Arthritis    Chicken pox    Migraines    Substance abuse (HCC) 2006   40 pk year tobacco     Patient Active Problem List   Diagnosis Date Noted   Rib pain 08/11/2022   Atypical chest pain 02/19/2021   Chronic sinusitis 02/19/2021   Chronic right hip pain 01/30/2021   Low back pain 01/30/2021   Prostate nodule 01/28/2021   Impingement syndrome of right shoulder 11/22/2019   Erectile dysfunction 04/16/2019   Essential hypertension 04/04/2016   Hyperlipidemia 04/04/2016   Fatigue 03/15/2015   BPH associated with nocturia 03/15/2015   Personal history of colonic polyps 11/07/2012   GERD (gastroesophageal reflux disease) 11/07/2012   Prostate cancer screening 09/11/2012   Reflux esophagitis 09/01/2012   Arthritis     Past Surgical History:  Procedure Laterality Date   APPENDECTOMY  1965   CATARACT EXTRACTION, BILATERAL Bilateral 2012   2013   COLONOSCOPY WITH PROPOFOL N/A 09/03/2016   Procedure: COLONOSCOPY WITH PROPOFOL;  Surgeon: Scot Jun, MD;  Location: [facility] ENDOSCOPY;  Service: Endoscopy;  Laterality: N/A;   HERNIA REPAIR Right 2011   HERNIA REPAIR Right 2012   redo of mesh , Katrinka Blazing   HIP SURGERY Right 2013       Home Medications    Prior to Admission medications   Medication Sig Start Date End Date Taking? Authorizing Provider   cyclobenzaprine (FLEXERIL) 10 MG tablet Take 1 tablet (10 mg total) by mouth 2 (two) times daily as needed for muscle spasms. 08/11/22  Yes Mickie Bail, NP  omeprazole (PRILOSEC) 20 MG capsule Take 1 capsule (20 mg total) by mouth daily. 02/19/21   Sherlene Shams, MD  sildenafil (REVATIO) 20 MG tablet TAKE 1 TABLET(20 MG) BY MOUTH THREE TIMES DAILY 12/18/19   Sherlene Shams, MD  tamsulosin (FLOMAX) 0.4 MG CAPS capsule Take 1 capsule (0.4 mg total) by mouth daily. 03/18/21   Sondra Come, MD  telmisartan (MICARDIS) 40 MG tablet TAKE 1 TABLET(40 MG) BY MOUTH DAILY 05/20/21   Sherlene Shams, MD  traMADol (ULTRAM) 50 MG tablet Take 2 tablets (100 mg total) by mouth daily as needed. 07/29/21   Sherlene Shams, MD    Family History Family History  Problem Relation Age of Onset   Stroke Mother    Cancer Father        leukemia    Social History Social History   Tobacco Use   Smoking status: Former    Types: Cigarettes    Quit date: 05/08/2004    Years since quitting: 18.2   Smokeless tobacco: Never  Substance Use Topics   Alcohol use: Yes    Alcohol/week: 10.0 standard drinks of alcohol    Types:  10 Standard drinks or equivalent per week    Comment: occasionally   Drug use: No     Allergies   Amlodipine   Review of Systems Review of Systems  Respiratory:  Negative for cough and shortness of breath.   Cardiovascular:  Negative for chest pain and palpitations.  Gastrointestinal:  Negative for abdominal pain, nausea and vomiting.  Musculoskeletal:  Positive for back pain. Negative for gait problem and joint swelling.  Skin:  Negative for color change, rash and wound.  Neurological:  Negative for weakness and numbness.     Physical Exam Triage Vital Signs ED Triage Vitals  Enc Vitals Group     BP 08/11/22 1047 (!) 148/79     Pulse Rate 08/11/22 1041 73     Resp 08/11/22 1041 18     Temp 08/11/22 1041 97.9 F (36.6 C)     Temp src --      SpO2 08/11/22 1041 95 %      Weight --      Height --      Head Circumference --      Peak Flow --      Pain Score 08/11/22 1042 8     Pain Loc --      Pain Edu? --      Excl. in GC? --    No data found.  Updated Vital Signs BP (!) 148/79   Pulse 73   Temp 97.9 F (36.6 C)   Resp 18   SpO2 95%   Visual Acuity Right Eye Distance:   Left Eye Distance:   Bilateral Distance:    Right Eye Near:   Left Eye Near:    Bilateral Near:     Physical Exam Vitals and nursing note reviewed.  Constitutional:      General: He is not in acute distress.    Appearance: He is well-developed.  HENT:     Mouth/Throat:     Mouth: Mucous membranes are moist.  Cardiovascular:     Rate and Rhythm: Normal rate and regular rhythm.     Heart sounds: Normal heart sounds.  Pulmonary:     Effort: Pulmonary effort is normal. No respiratory distress.     Breath sounds: Normal breath sounds.  Abdominal:     General: Bowel sounds are normal.     Palpations: Abdomen is soft.     Tenderness: There is no abdominal tenderness. There is no guarding or rebound.  Musculoskeletal:        General: Tenderness present. No swelling or deformity. Normal range of motion.     Cervical back: Neck supple.       Back:  Skin:    General: Skin is warm and dry.     Capillary Refill: Capillary refill takes less than 2 seconds.     Findings: No bruising, erythema, lesion or rash.  Neurological:     General: No focal deficit present.     Mental Status: He is alert and oriented to person, place, and time.     Sensory: No sensory deficit.     Motor: No weakness.  Psychiatric:        Mood and Affect: Mood normal.        Behavior: Behavior normal.      UC Treatments / Results  Labs (all labs ordered are listed, but only abnormal results are displayed) Labs Reviewed - No data to display  EKG   Radiology DG Ribs Unilateral W/Chest Right  Result Date:  08/11/2022 CLINICAL DATA:  Pain at after fall 2 weeks ago. Worsening posterior rib  pain EXAM: RIGHT RIBS AND CHEST - 3+ VIEW COMPARISON:  Chest radiograph 03/27/2021 FINDINGS: Heart size and mediastinal contours are normal. No pleural fluid, interstitial edema, airspace disease or pneumothorax. No displaced rib fractures identified. IMPRESSION: Negative. Electronically Signed   By: Signa Kell M.D.   On: 08/11/2022 11:05    Procedures Procedures (including critical care time)  Medications Ordered in UC Medications - No data to display  Initial Impression / Assessment and Plan / UC Course  I have reviewed the triage vital signs and the nursing notes.  Pertinent labs & imaging results that were available during my care of the patient were reviewed by me and considered in my medical decision making (see chart for details).    Left rib pain and contusion.  X-ray shows no acute bony abnormality.  Treating with muscle relaxer; precautions for drowsiness with this medication discussed.  Tylenol or ibuprofen as needed.  Instructed patient to follow-up with his PCP or an orthopedist if his symptoms are not improving.  Contact information for on-call Ortho provided.  Education provided on rib contusion.  Patient agrees to plan of care.  Final Clinical Impressions(s) / UC Diagnoses   Final diagnoses:  Rib pain  Rib contusion, left, initial encounter     Discharge Instructions      Take ibuprofen as needed for discomfort.  Take the muscle relaxer as needed for muscle spasm; Do not drive, operate machinery, or drink alcohol with this medication as it can cause drowsiness.   Follow up with your primary care provider or an orthopedist if your symptoms are not improving.         ED Prescriptions     Medication Sig Dispense Auth. Provider   cyclobenzaprine (FLEXERIL) 10 MG tablet Take 1 tablet (10 mg total) by mouth 2 (two) times daily as needed for muscle spasms. 20 tablet Mickie Bail, NP      PDMP not reviewed this encounter.   Mickie Bail, NP 08/11/22  1124

## 2022-08-11 NOTE — Discharge Instructions (Addendum)
Take ibuprofen as needed for discomfort.  Take the muscle relaxer as needed for muscle spasm; Do not drive, operate machinery, or drink alcohol with this medication as it can cause drowsiness.   Follow up with your primary care provider or an orthopedist if your symptoms are not improving.     

## 2022-09-19 IMAGING — DX DG SHOULDER 2+V*R*
3 series · 3 of 3 positions shown · non-contrast
Comparison: None.

CLINICAL DATA: Shoulder pain.

EXAM:
RIGHT SHOULDER - 2+ VIEW

[shoulder (grashey) ap]
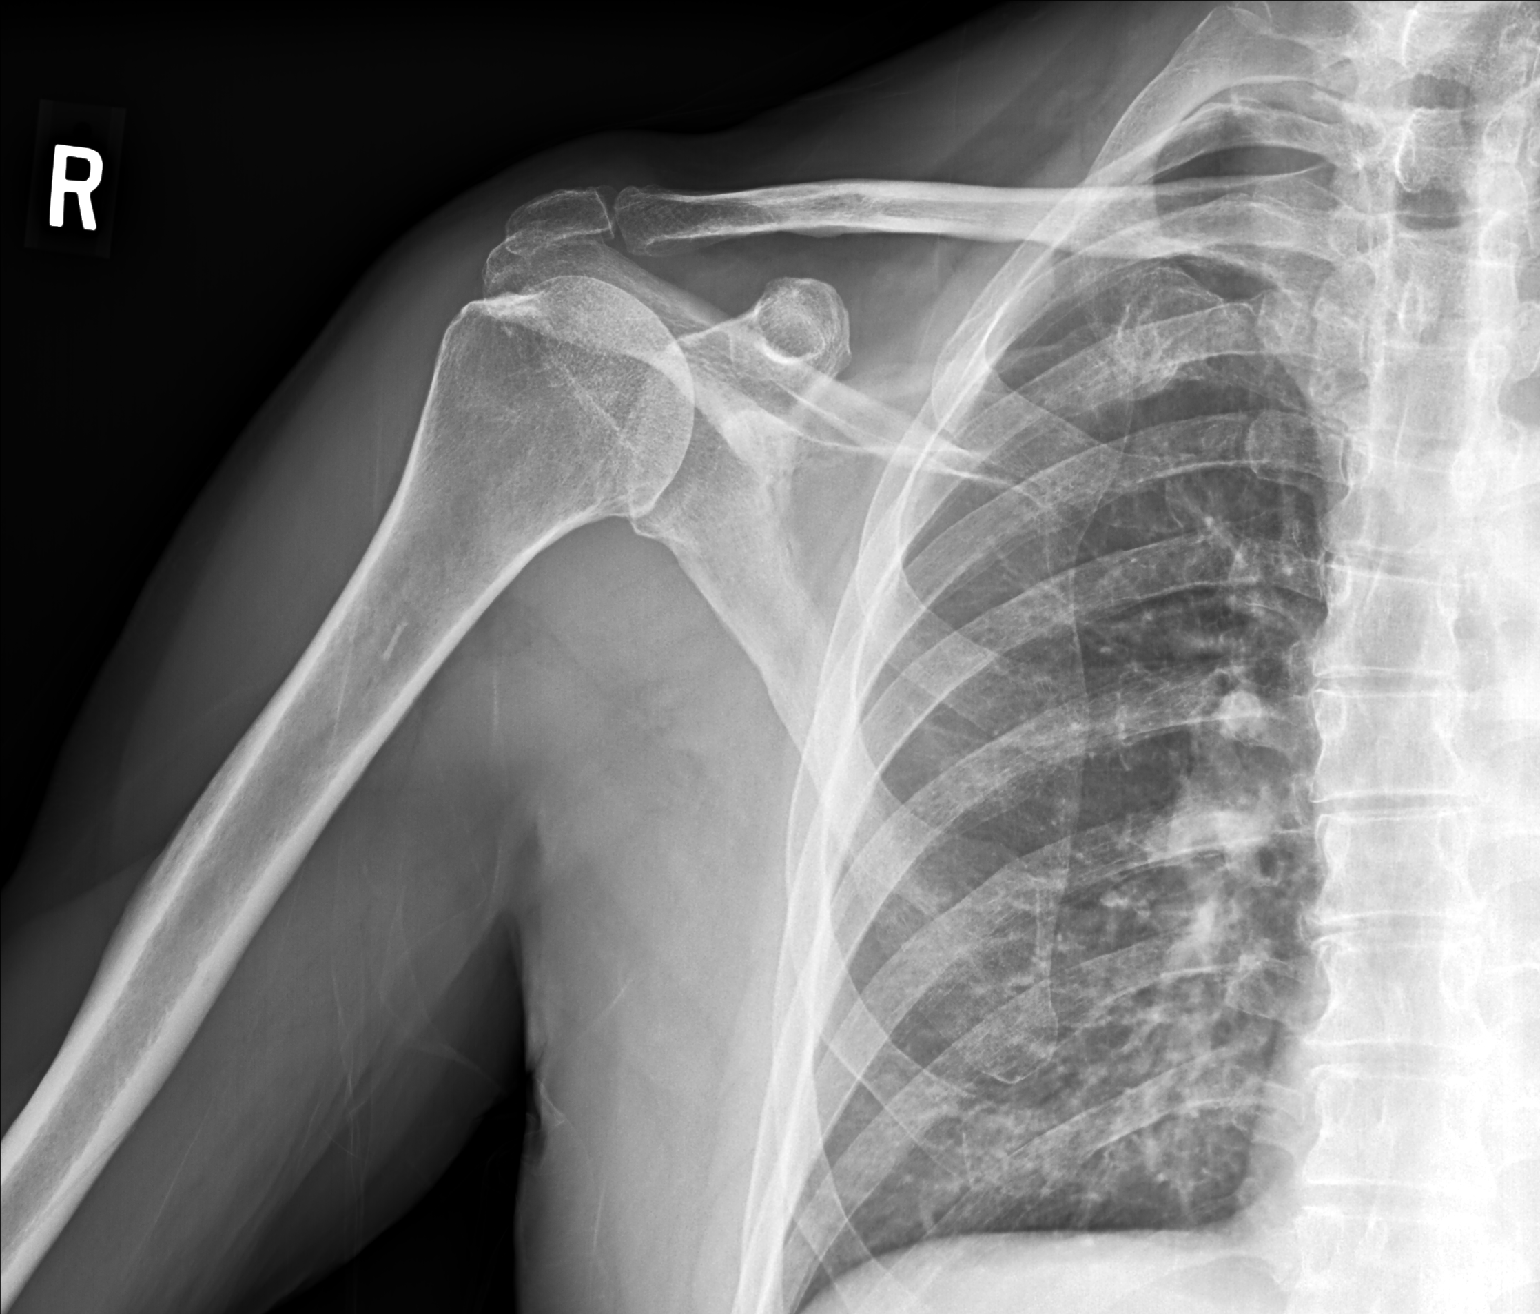

[shoulder y view]
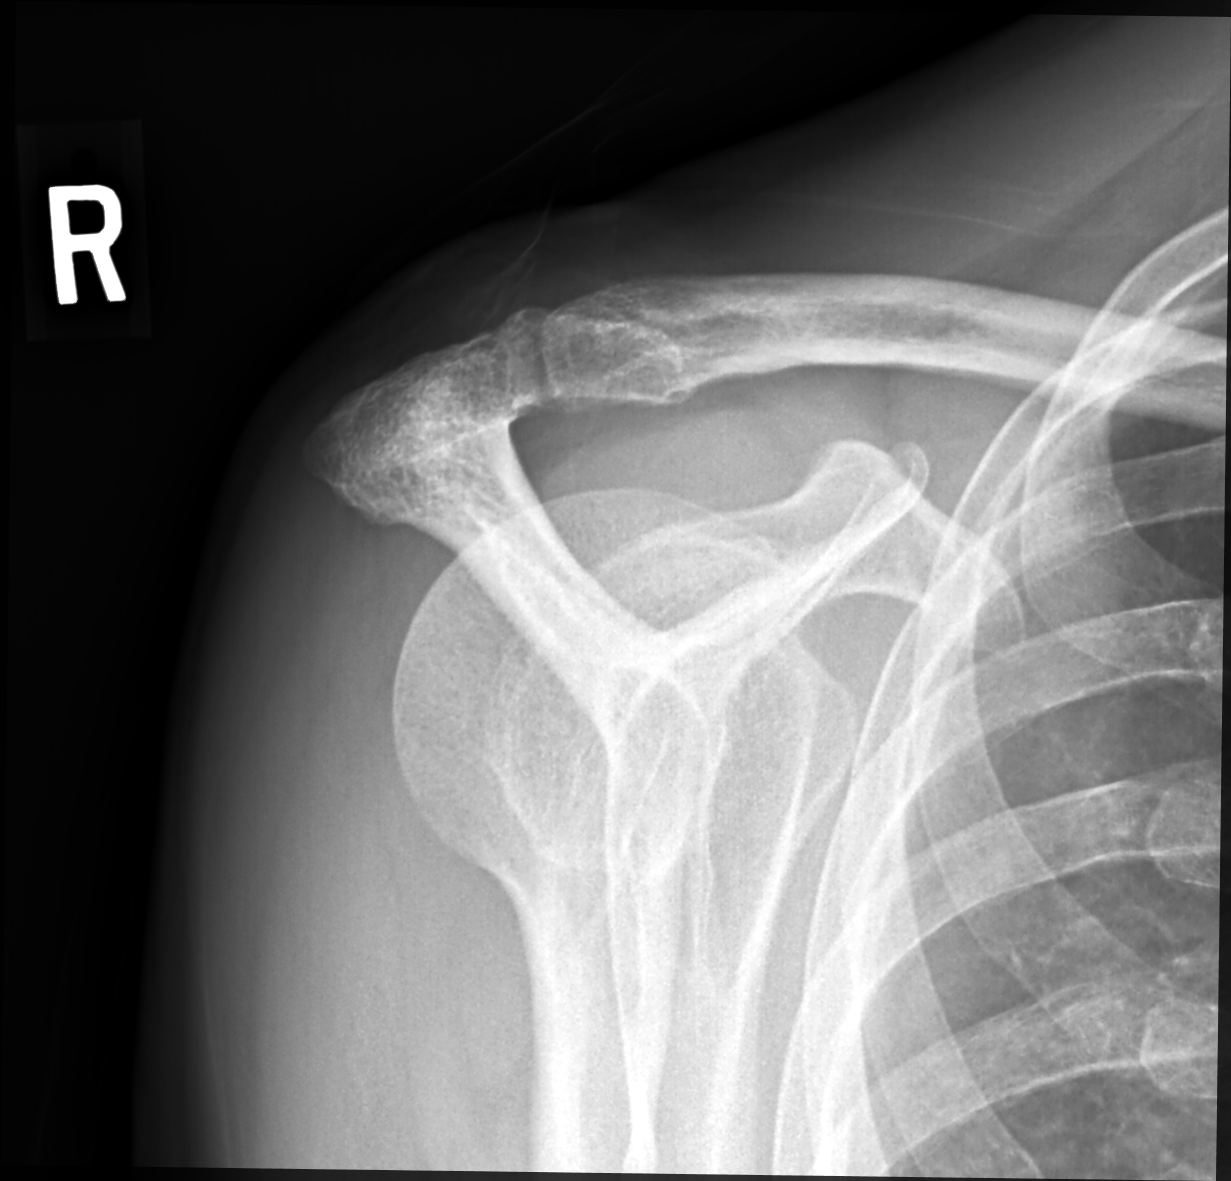

[shoulder axillary pa]
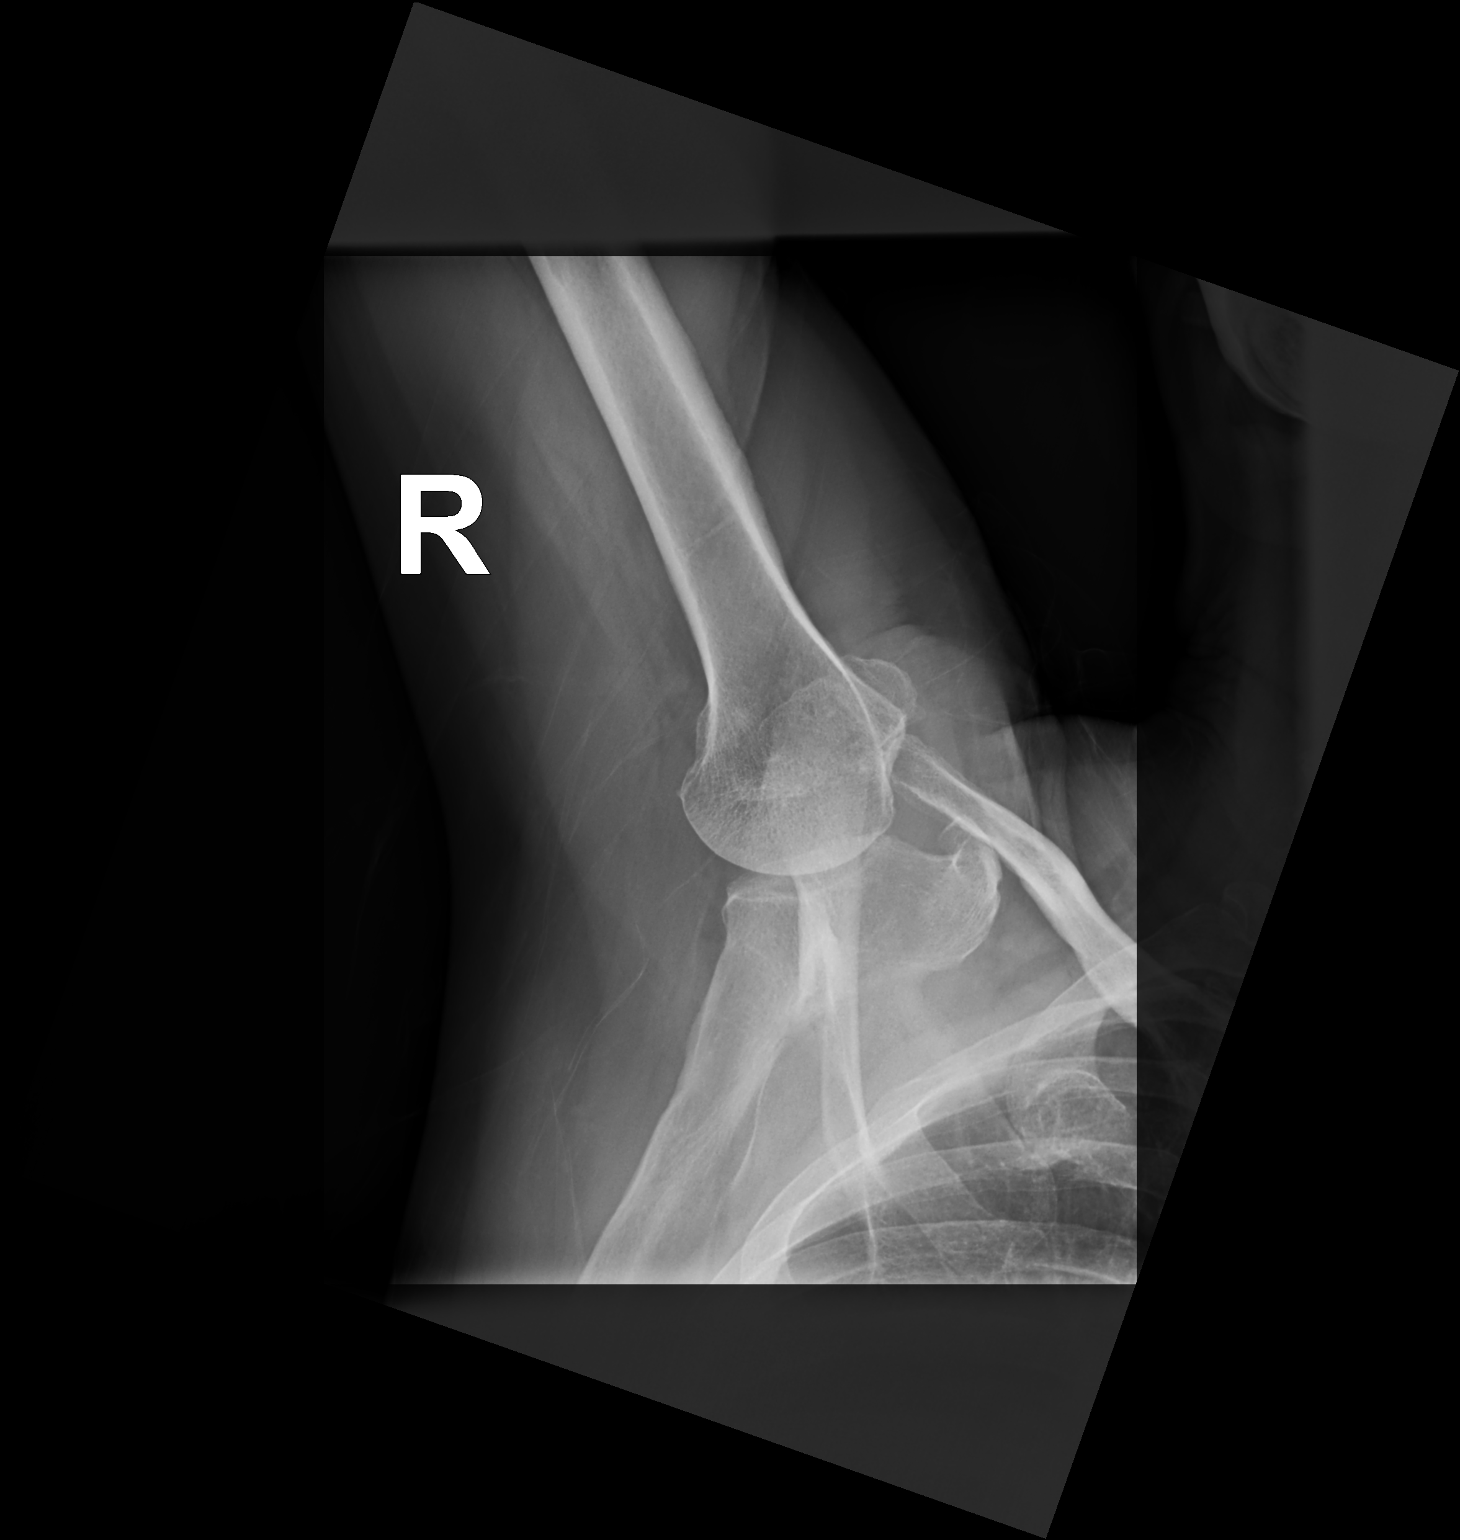

[3 of 3 positions shown; findings below may reference images not displayed]

FINDINGS: There is no evidence of fracture or dislocation. There is no
evidence of arthropathy or other focal bone abnormality. Soft
tissues are unremarkable.
IMPRESSION: Negative.

## 2022-10-28 DIAGNOSIS — H43813 Vitreous degeneration, bilateral: Secondary | ICD-10-CM | POA: Diagnosis not present

## 2022-10-28 DIAGNOSIS — Z961 Presence of intraocular lens: Secondary | ICD-10-CM | POA: Diagnosis not present

## 2022-10-28 DIAGNOSIS — H538 Other visual disturbances: Secondary | ICD-10-CM | POA: Diagnosis not present

## 2023-09-07 ENCOUNTER — Encounter: Payer: Self-pay | Admitting: Internal Medicine

## 2023-09-07 ENCOUNTER — Ambulatory Visit (INDEPENDENT_AMBULATORY_CARE_PROVIDER_SITE_OTHER): Admitting: Internal Medicine

## 2023-09-07 VITALS — BP 128/74 | HR 74 | Temp 97.4°F | Ht 68.5 in | Wt 162.6 lb

## 2023-09-07 DIAGNOSIS — M5441 Lumbago with sciatica, right side: Secondary | ICD-10-CM

## 2023-09-07 DIAGNOSIS — Z860100 Personal history of colon polyps, unspecified: Secondary | ICD-10-CM

## 2023-09-07 DIAGNOSIS — R7301 Impaired fasting glucose: Secondary | ICD-10-CM | POA: Diagnosis not present

## 2023-09-07 DIAGNOSIS — Z0000 Encounter for general adult medical examination without abnormal findings: Secondary | ICD-10-CM

## 2023-09-07 DIAGNOSIS — R0789 Other chest pain: Secondary | ICD-10-CM | POA: Diagnosis not present

## 2023-09-07 DIAGNOSIS — Z125 Encounter for screening for malignant neoplasm of prostate: Secondary | ICD-10-CM

## 2023-09-07 DIAGNOSIS — I10 Essential (primary) hypertension: Secondary | ICD-10-CM

## 2023-09-07 DIAGNOSIS — R351 Nocturia: Secondary | ICD-10-CM | POA: Diagnosis not present

## 2023-09-07 DIAGNOSIS — E7800 Pure hypercholesterolemia, unspecified: Secondary | ICD-10-CM | POA: Diagnosis not present

## 2023-09-07 DIAGNOSIS — R5383 Other fatigue: Secondary | ICD-10-CM

## 2023-09-07 DIAGNOSIS — N402 Nodular prostate without lower urinary tract symptoms: Secondary | ICD-10-CM | POA: Diagnosis not present

## 2023-09-07 DIAGNOSIS — M1990 Unspecified osteoarthritis, unspecified site: Secondary | ICD-10-CM

## 2023-09-07 DIAGNOSIS — N401 Enlarged prostate with lower urinary tract symptoms: Secondary | ICD-10-CM | POA: Diagnosis not present

## 2023-09-07 LAB — CBC WITH DIFFERENTIAL/PLATELET
Basophils Absolute: 0.1 K/uL (ref 0.0–0.1)
Basophils Relative: 1.2 % (ref 0.0–3.0)
Eosinophils Absolute: 0.3 K/uL (ref 0.0–0.7)
Eosinophils Relative: 4.3 % (ref 0.0–5.0)
HCT: 41.7 % (ref 39.0–52.0)
Hemoglobin: 13.5 g/dL (ref 13.0–17.0)
Lymphocytes Relative: 21.8 % (ref 12.0–46.0)
Lymphs Abs: 1.5 K/uL (ref 0.7–4.0)
MCHC: 32.5 g/dL (ref 30.0–36.0)
MCV: 89.8 fl (ref 78.0–100.0)
Monocytes Absolute: 0.7 K/uL (ref 0.1–1.0)
Monocytes Relative: 10.7 % (ref 3.0–12.0)
Neutro Abs: 4.3 K/uL (ref 1.4–7.7)
Neutrophils Relative %: 62 % (ref 43.0–77.0)
Platelets: 363 K/uL (ref 150.0–400.0)
RBC: 4.65 Mil/uL (ref 4.22–5.81)
RDW: 14.7 % (ref 11.5–15.5)
WBC: 6.9 K/uL (ref 4.0–10.5)

## 2023-09-07 LAB — URINALYSIS, ROUTINE W REFLEX MICROSCOPIC
Bilirubin Urine: NEGATIVE
Hgb urine dipstick: NEGATIVE
Ketones, ur: NEGATIVE
Leukocytes,Ua: NEGATIVE
Nitrite: NEGATIVE
Specific Gravity, Urine: 1.025 (ref 1.000–1.030)
Total Protein, Urine: NEGATIVE
Urine Glucose: NEGATIVE
Urobilinogen, UA: 0.2 (ref 0.0–1.0)
pH: 6 (ref 5.0–8.0)

## 2023-09-07 LAB — LIPID PANEL
Cholesterol: 190 mg/dL (ref 0–200)
HDL: 61.3 mg/dL (ref >39.00)
LDL Cholesterol: 110 mg/dL — ABNORMAL HIGH (ref 0–99)
NonHDL: 128.36
Total CHOL/HDL Ratio: 3
Triglycerides: 92 mg/dL (ref 0.0–149.0)
VLDL: 18.4 mg/dL (ref 0.0–40.0)

## 2023-09-07 LAB — TSH: TSH: 2.12 u[IU]/mL (ref 0.35–5.50)

## 2023-09-07 LAB — HEMOGLOBIN A1C: Hgb A1c MFr Bld: 6.2 % (ref 4.6–6.5)

## 2023-09-07 LAB — MICROALBUMIN / CREATININE URINE RATIO
Creatinine,U: 231.2 mg/dL
Microalb Creat Ratio: 6.1 mg/g (ref 0.0–30.0)
Microalb, Ur: 1.4 mg/dL (ref 0.0–1.9)

## 2023-09-07 LAB — LDL CHOLESTEROL, DIRECT: Direct LDL: 105 mg/dL

## 2023-09-07 LAB — COMPREHENSIVE METABOLIC PANEL WITH GFR
ALT: 9 U/L (ref 0–53)
AST: 11 U/L (ref 0–37)
Albumin: 4.6 g/dL (ref 3.5–5.2)
Alkaline Phosphatase: 78 U/L (ref 39–117)
BUN: 14 mg/dL (ref 6–23)
CO2: 28 meq/L (ref 19–32)
Calcium: 9.6 mg/dL (ref 8.4–10.5)
Chloride: 103 meq/L (ref 96–112)
Creatinine, Ser: 0.89 mg/dL (ref 0.40–1.50)
GFR: 83.9 mL/min (ref >60.00)
Glucose, Bld: 92 mg/dL (ref 70–99)
Potassium: 4.6 meq/L (ref 3.5–5.1)
Sodium: 140 meq/L (ref 135–145)
Total Bilirubin: 0.5 mg/dL (ref 0.2–1.2)
Total Protein: 7.1 g/dL (ref 6.0–8.3)

## 2023-09-07 LAB — PSA, MEDICARE: PSA: 3.76 ng/mL (ref 0.10–4.00)

## 2023-09-07 MED ORDER — TETANUS-DIPHTH-ACELL PERTUSSIS 5-2.5-18.5 LF-MCG/0.5 IM SUSY: 0.5000 mL | PREFILLED_SYRINGE | Freq: Once | INTRAMUSCULAR | 0 refills | Status: AC

## 2023-09-07 MED ORDER — PREDNISONE 10 MG PO TABS: ORAL_TABLET | ORAL | 0 refills | Status: AC

## 2023-09-07 MED ORDER — OMEPRAZOLE 20 MG PO CPDR: 20.0000 mg | DELAYED_RELEASE_CAPSULE | Freq: Every day | ORAL | 1 refills | Status: AC

## 2023-09-07 MED ORDER — TRAMADOL HCL 50 MG PO TABS
100.0000 mg | ORAL_TABLET | Freq: Every day | ORAL | 0 refills | Status: AC | PRN
Start: 2023-09-07 — End: ?

## 2023-09-07 NOTE — Assessment & Plan Note (Signed)
 Has not had any recurrence since stopping telmisartan .  Symptoms were likely due ot GERD

## 2023-09-07 NOTE — Assessment & Plan Note (Signed)
 LAST POLYPB WAS HYPERPLASTIC ON 2018 COLONOSCOPY. SO NO FOLLOW UP WAS MADE AS HE WOULD BE 73 YRS OLD.

## 2023-09-07 NOTE — Assessment & Plan Note (Signed)
 He was referred to Urology and seen in 2023.  PSA's were normal and prostate ultrasound was normal. Flomax  was prescribed for nocturia bu paitent has discontinued due to lack of effectiveness.

## 2023-09-07 NOTE — Assessment & Plan Note (Signed)
 Not responsive to flomax .  Checking UA culture and PSA

## 2023-09-07 NOTE — Assessment & Plan Note (Signed)
 age appropriate education and counseling updated, referrals for preventative services and immunizations addressed, dietary and smoking counseling addressed, most recent labs reviewed.  I have personally reviewed and have noted:   1) the patient's medical and social history 2) The pt's use of alcohol, tobacco, and illicit drugs 3) The patient's current medications and supplements 4) Functional ability including ADL's, fall risk, home safety risk, hearing and visual impairment 5) Diet and physical activities 6) Evidence for depression or mood disorder 7) The patient's height, weight, and BMI have been recorded in the chart 8) TDAP vaccination advised    I have made referrals, and provided counseling and education based on review of the above

## 2023-09-07 NOTE — Assessment & Plan Note (Signed)
 The stopped aking telmisartan  40 mg  due to side effect of chest pain.  h home readings consistently < 130/80.

## 2023-09-07 NOTE — Progress Notes (Unsigned)
 Patient ID: Timothy Carrillo, male    DOB: December 21, 1950  Age: 73 y.o. MRN: [id number]  The patient is here for annual preventive examination and management of other chronic and acute problems.   The risk factors are reflected in the social history.   The roster of all physicians providing medical care to patient - is listed in the Snapshot section of the chart.   Activities of daily living:  The patient is 100% independent in all ADLs: dressing, toileting, feeding as well as independent mobility   Home safety : The patient has smoke detectors in the home. They wear seatbelts.  There are no unsecured firearms at home. There is no violence in the home.    There is no risks for hepatitis, STDs or HIV. There is no   history of blood transfusion. They have no travel history to infectious disease endemic areas of the world.   The patient has seen their dentist in the last six month. They have seen their eye doctor in the last year. The patinet  denies slight hearing difficulty with regard to whispered voices and some television programs.  They have deferred audiologic testing in the last year.  They do not  have excessive sun exposure. Discussed the need for sun protection: hats, long sleeves and use of sunscreen if there is significant sun exposure.    Diet: the importance of a healthy diet is discussed. They do have a healthy diet.   The benefits of regular aerobic exercise were discussed. The patient  exercises  3 to 5 days per week  for  60 minutes.    Depression screen: there are no signs or vegative symptoms of depression- irritability, change in appetite, anhedonia, sadness/tearfullness.   The following portions of the patient's history were reviewed and updated as appropriate: allergies, current medications, past family history, past medical history,  past surgical history, past social history  and problem list.   Visual acuity was not assessed per patient preference since the patient has  regular follow up with an  ophthalmologist. Hearing and body mass index were assessed and reviewed.    During the course of the visit the patient was educated and counseled about appropriate screening and preventive services including : fall prevention , diabetes screening, nutrition counseling, colorectal cancer screening, and recommended immunizations.    Chief Complaint:   CHRONIC BACK PAIN  radiates to right knee aggravated by golf .  Has episodes of going out unable to walk or stand for 3 days without crutches.   BPH : nocturia up to 9 times per night  flomax  did not help  HTN:  home bps 125/80  without telmisartan     Review of Symptoms  Patient denies headache, fevers, malaise, unintentional weight loss, skin rash, eye pain, sinus congestion and sinus pain, sore throat, dysphagia,  hemoptysis , cough, dyspnea, wheezing, chest pain, palpitations, orthopnea, edema, abdominal pain, nausea, melena, diarrhea, constipation, flank pain, dysuria, hematuria, urinary  Frequency, nocturia, numbness, tingling, seizures,  Focal weakness, Loss of consciousness,  Tremor, insomnia, depression, anxiety, and suicidal ideation.    Physical Exam:  BP 130/74 (BP Location: Left Arm, Patient Position: Sitting, Cuff Size: Normal)   Pulse 74   Temp (!) 97.4 F (36.3 C) (Oral)   Ht 5' 8.5 (1.74 m)   Wt 162 lb 9.6 oz (73.8 kg)   SpO2 98%   BMI 24.36 kg/m    Physical Exam  Assessment and Plan: Essential hypertension  Prostate cancer screening  Pure  hypercholesterolemia  Other fatigue  Impaired fasting glucose  Arthritis    No follow-ups on file.  Verneita LITTIE Kettering, MD

## 2023-09-07 NOTE — Assessment & Plan Note (Signed)
10 yr risk is < 15 %  He continues to defer statin therapy AND PREFERS TO Continue red yeast rice and with lifestyle changes   Last lipids Lab Results  Component Value Date   CHOL 171 04/17/2020   HDL 66.70 04/17/2020   LDLCALC 92 04/17/2020   LDLDIRECT 102.0 03/31/2016   TRIG 63.0 04/17/2020   CHOLHDL 3 04/17/2020

## 2023-09-08 LAB — URINE CULTURE
MICRO NUMBER:: 16765137
Result:: NO GROWTH
SPECIMEN QUALITY:: ADEQUATE

## 2023-09-09 NOTE — Assessment & Plan Note (Signed)
 Chronic with infrequent episodes of spasm and severe pain.  He also has  right hip degenerative changes complicating pain syndrome.  Prior MRI noted no surgical issues in 2013 and he declines referrals to orthopedics .  Prednisone  taper and 7 day supply of tramadol  written for next occurrence.

## 2023-09-09 NOTE — Assessment & Plan Note (Signed)
 PSAs have been normal and he was evaluated by Urology recently given history of prostate nodule noted on prior MRI .  No further screening is recommended after today's PSA as he is 73   Lab Results  Component Value Date   PSA 3.76 09/07/2023   PSA 3.57 04/17/2020   PSA 2.65 04/16/2019

## 2023-09-10 ENCOUNTER — Ambulatory Visit: Payer: Self-pay | Admitting: Internal Medicine

## 2023-09-23 ENCOUNTER — Encounter: Admitting: Internal Medicine

## 2023-12-15 ENCOUNTER — Ambulatory Visit: Payer: Self-pay

## 2023-12-15 NOTE — Telephone Encounter (Signed)
 FYI Only or Action Required?: Action required by provider: request for appointment.  Patient was last seen in primary care on 09/07/2023 by Marylynn Verneita CROME, MD.  Called Nurse Triage reporting Numbness.  Symptoms began several weeks ago.  Interventions attempted: Nothing.  Symptoms are: stable.  Triage Disposition: See PCP When Office is Open (Within 3 Days)  Patient/caregiver understands and will follow disposition?: Yes Reason for Disposition  [1] Numbness or tingling in one or both feet AND [2] is a chronic symptom (recurrent or ongoing AND present > 4 weeks)  Answer Assessment - Initial Assessment Questions Denies n/v/d. No available appointments in PCP office, please advise. Call back [phone]  1. SYMPTOM: What is the main symptom you are concerned about? (e.g., weakness, numbness)     Pins and needles feeling in feet, and waist and stomach  2. ONSET: When did this start? (e.g., minutes, hours, days; while sleeping)     3 weeks ago, states feet has done it for years  3. PATTERN Does this come and go, or has it been constant since it started?  Is it present now?     Constant in stomach area, comes and goes in the feet, usually happens at night before going to bed  4. CARDIAC SYMPTOMS: Have you had any of the following symptoms: chest pain, difficulty breathing, palpitations?     Denies  5. NEUROLOGIC SYMPTOMS: Have you had any of the following symptoms: headache, dizziness, vision loss, double vision, changes in speech, unsteady on your feet?     Numbness in legs, sometimes feels unsteady and weak  6. OTHER SYMPTOMS: Do you have any other symptoms?     Denies  Protocols used: Neurolog[facility]  Copied from CRM [id number]. Topic: Clinical - Red Word Triage >> Dec 15, 2023 12:19 PM Dedra B wrote: Red Word that prompted transfer to Nurse Triage: Pt having tingling and burning around his waistline and in feet. Warm transfer to NT.

## 2023-12-16 ENCOUNTER — Ambulatory Visit: Admitting: Nurse Practitioner

## 2023-12-16 ENCOUNTER — Encounter: Payer: Self-pay | Admitting: Nurse Practitioner

## 2023-12-16 VITALS — BP 128/78 | HR 75 | Temp 98.1°F | Ht 68.5 in | Wt 160.0 lb

## 2023-12-16 DIAGNOSIS — R202 Paresthesia of skin: Secondary | ICD-10-CM | POA: Diagnosis not present

## 2023-12-16 DIAGNOSIS — R200 Anesthesia of skin: Secondary | ICD-10-CM

## 2023-12-16 DIAGNOSIS — K219 Gastro-esophageal reflux disease without esophagitis: Secondary | ICD-10-CM | POA: Diagnosis not present

## 2023-12-16 DIAGNOSIS — R1031 Right lower quadrant pain: Secondary | ICD-10-CM | POA: Diagnosis not present

## 2023-12-16 DIAGNOSIS — R7303 Prediabetes: Secondary | ICD-10-CM

## 2023-12-16 NOTE — Progress Notes (Signed)
 Established Patient Office Visit  Subjective:  Patient ID: Timothy Carrillo, male    DOB: [birth year]  Age: 73 y.o. MRN: [id number]  CC:  Chief Complaint  Patient presents with   Acute Visit    Pen & needles in bilateral feet, burning pain in left side from left of belly button to left kidney & electrical shock pain in his right hip   Discussed the use of AI scribe software for clinical note transcription with the patient, who gave verbal consent to proceed.   History of Present Illness  JAROM GOVAN is a 73 year old male who presents with a three-week history of burning and stinging sensations in the right abdominal area.  He experiences a burning and stinging sensation on the skin around the right abdominal area, persisting for three weeks. There is  no visible rash, but the pain was severe enough to prevent contact with the area. The stinging sensation is less severe now but persists, with a nighttime sensation similar to cold extremities placed in warm water.  Intermittent 'electrical shock' sensation occurs in the right groin area, which is sudden and painful. This can happen at any time, including during activities like golf, and is sometimes triggered by bumping his leg.  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 Previous MRIs and spinal shots did not show significant issues or provide relief. He has had hip evaluations and a procedure at [facility]. He takes tramadol  for arthritis in his hands and back but is unsure if it alleviates the current symptoms.  He experiences bloating and gas and takes omeprazole  for reflux as needed. He has not been taking telmisartan  or sildenafil  recently. His appetite is normal, and he has regular bowel movements.   He has not had shingles before and has not received the shingles vaccine.  Past Medical History:  Diagnosis Date   Allergy    Arthritis    Chicken pox     Migraines    Substance abuse (HCC) 2006   40 pk year tobacco     Past Surgical History:  Procedure Laterality Date   APPENDECTOMY  1965   CATARACT EXTRACTION, BILATERAL Bilateral 2012   2013   COLONOSCOPY WITH PROPOFOL  N/A 09/03/2016   Procedure: COLONOSCOPY WITH PROPOFOL ;  Surgeon: Viktoria Lamar DASEN, MD;  Location: [facility] ENDOSCOPY;  Service: Endoscopy;  Laterality: N/A;   HERNIA REPAIR Right 2011   HERNIA REPAIR Right 2012   redo of mesh , Claudene   HIP SURGERY Right 2013    Family History  Problem Relation Age of Onset   Stroke Mother    Cancer Father        leukemia    Social History   Socioeconomic History   Marital status: Married    Spouse name: Not on file   Number of children: Not on file   Years of education: Not on file   Highest education level: Not on file  Occupational History   Occupation: carpenter    Comment: self employed  Tobacco Use   Smoking status: Former    Current packs/day: 0.00    Types: Cigarettes    Quit date: 05/08/2004    Years since quitting: 19.6   Smokeless tobacco: Never  Substance and Sexual Activity   Alcohol use: Yes    Alcohol/week: 10.0 standard drinks of alcohol    Types: 10 Standard drinks or equivalent per week  Comment: occasionally   Drug use: No   Sexual activity: Yes    Partners: Female  Other Topics Concern   Not on file  Social History Narrative   Not on file   Social Drivers of Health   Financial Resource Strain: Not on file  Food Insecurity: Not on file  Transportation Needs: Not on file  Physical Activity: Not on file  Stress: Not on file  Social Connections: Not on file  Intimate Partner Violence: Not on file     Outpatient Medications Prior to Visit  Medication Sig Dispense Refill   cyclobenzaprine  (FLEXERIL ) 10 MG tablet Take 1 tablet (10 mg total) by mouth 2 (two) times daily as needed for muscle spasms. 20 tablet 0   omeprazole  (PRILOSEC) 20 MG capsule Take 1 capsule (20 mg total) by mouth  daily. 90 capsule 1   predniSONE  (DELTASONE ) 10 MG tablet 6 tablets daily for 3 days, then reduce by 1 tablet daily until gone 33 tablet 0   traMADol  (ULTRAM ) 50 MG tablet Take 2 tablets (100 mg total) by mouth daily as needed. 60 tablet 0   sildenafil  (REVATIO ) 20 MG tablet TAKE 1 TABLET(20 MG) BY MOUTH THREE TIMES DAILY (Patient not taking: Reported on 09/07/2023) 90 tablet 5   telmisartan  (MICARDIS ) 40 MG tablet TAKE 1 TABLET(40 MG) BY MOUTH DAILY (Patient not taking: Reported on 09/07/2023) 30 tablet 0   No facility-administered medications prior to visit.    Allergies  Allergen Reactions   Amlodipine      CHEST PAIN , HEADACHE     ROS Review of Systems Negative unless indicated in HPI.    Objective:    Physical Exam Constitutional:      Appearance: Normal appearance.  HENT:     Mouth/Throat:     Mouth: Mucous membranes are moist.  Eyes:     Conjunctiva/sclera: Conjunctivae normal.     Pupils: Pupils are equal, round, and reactive to light.  Cardiovascular:     Rate and Rhythm: Normal rate and regular rhythm.     Pulses: Normal pulses.     Heart sounds: Normal heart sounds.  Pulmonary:     Effort: Pulmonary effort is normal.     Breath sounds: Normal breath sounds.  Abdominal:     General: Bowel sounds are normal.     Palpations: Abdomen is soft. There is no mass.     Tenderness: There is no guarding.  Musculoskeletal:     Cervical back: Normal range of motion. No tenderness.  Skin:    General: Skin is warm.     Findings: No bruising.  Neurological:     General: No focal deficit present.     Mental Status: He is alert and oriented to person, place, and time. Mental status is at baseline.  Psychiatric:        Mood and Affect: Mood normal.        Behavior: Behavior normal.        Thought Content: Thought content normal.        Judgment: Judgment normal.     BP 128/78   Pulse 75   Temp 98.1 F (36.7 C)   Ht 5' 8.5 (1.74 m)   Wt 160 lb (72.6 kg)   SpO2  95%   BMI 23.97 kg/m  Wt Readings from Last 3 Encounters:  12/16/23 160 lb (72.6 kg)  09/07/23 162 lb 9.6 oz (73.8 kg)  04/24/21 163 lb 3.2 oz (74 kg)     Health Maintenance  Topic Date Due   Zoster Vaccines- Shingrix (1 of 2) Never done   Medicare Annual Wellness (AWV)  03/31/2017   DTaP/Tdap/Td (2 - Td or Tdap) 08/31/2022   COVID-19 Vaccine (6 - 2025-26 season) 10/10/2023   Influenza Vaccine  05/08/2024 (Originally 09/09/2023)   Hepatitis C Screening  Completed   Meningococcal B Vaccine  Aged Out   Pneumococcal Vaccine: 50+ Years  Discontinued   Colonoscopy  Discontinued    There are no preventive care reminders to display for this patient.  Lab Results  Component Value Date   TSH 2.12 09/07/2023   Lab Results  Component Value Date   WBC 7.0 12/16/2023   HGB 13.5 12/16/2023   HCT 41.5 12/16/2023   MCV 93 12/16/2023   PLT 364 12/16/2023   Lab Results  Component Value Date   NA 143 12/16/2023   K 4.6 12/16/2023   CO2 23 12/16/2023   GLUCOSE 89 12/16/2023   BUN 20 12/16/2023   CREATININE 1.07 12/16/2023   BILITOT 0.2 12/16/2023   ALKPHOS 78 12/16/2023   AST 14 12/16/2023   ALT 11 12/16/2023   PROT 6.7 12/16/2023   ALBUMIN 4.5 12/16/2023   CALCIUM 9.8 12/16/2023   EGFR 72 12/16/2023   GFR 83.90 09/07/2023   Lab Results  Component Value Date   CHOL 190 09/07/2023   Lab Results  Component Value Date   HDL 61.30 09/07/2023   Lab Results  Component Value Date   LDLCALC 110 (H) 09/07/2023   Lab Results  Component Value Date   TRIG 92.0 09/07/2023   Lab Results  Component Value Date   CHOLHDL 3 09/07/2023   Lab Results  Component Value Date   HGBA1C 5.7 (H) 12/16/2023      Assessment & Plan:   Assessment & Plan Numbness and tingling Intermittent stinging and burning sensation around the right abdominal area for three weeks, with no visible rash. Symptoms include electrical shock-like sensations and pins and needles feeling, exacerbated by  touch. - Ordered blood tests including B12, folate, and electrolytes. - Consider gabapentin if lab results are normal. - Monitor for rash development and report if observed.  Orders:   B12 and Folate Panel   Comp Met (CMET)   CBC w/Diff  Prediabetes  Orders:   HgB A1c  Groin pain, right Patient reports intermittent electrical shock sensation in the right groin area.  Denies any difficulty walking. - Offered referral to orthopedic specialist patient politely deferred.     Gastroesophageal reflux disease, unspecified whether esophagitis present Advised patient to continue Prilosec half an hour before breakfast.        Follow-up: Return if symptoms worsen or fail to improve.   Candis Kabel, NP

## 2023-12-16 NOTE — Assessment & Plan Note (Addendum)
 Intermittent stinging and burning sensation around the right abdominal area for three weeks, with no visible rash. Symptoms include electrical shock-like sensations and pins and needles feeling, exacerbated by touch. - Ordered blood tests including B12, folate, and electrolytes. - Consider gabapentin if lab results are normal. - Monitor for rash development and report if observed.  Orders:   B12 and Folate Panel   Comp Met (CMET)   CBC w/Diff

## 2023-12-17 LAB — COMPREHENSIVE METABOLIC PANEL WITH GFR
ALT: 11 IU/L (ref 0–44)
AST: 14 IU/L (ref 0–40)
Albumin: 4.5 g/dL (ref 3.8–4.8)
Alkaline Phosphatase: 78 IU/L (ref 47–123)
BUN/Creatinine Ratio: 19 (ref 10–24)
BUN: 20 mg/dL (ref 8–27)
Bilirubin Total: 0.2 mg/dL (ref 0.0–1.2)
CO2: 23 mmol/L (ref 20–29)
Calcium: 9.8 mg/dL (ref 8.6–10.2)
Chloride: 106 mmol/L (ref 96–106)
Creatinine, Ser: 1.07 mg/dL (ref 0.76–1.27)
Globulin, Total: 2.2 g/dL (ref 1.5–4.5)
Glucose: 89 mg/dL (ref 70–99)
Potassium: 4.6 mmol/L (ref 3.5–5.2)
Sodium: 143 mmol/L (ref 134–144)
Total Protein: 6.7 g/dL (ref 6.0–8.5)
eGFR: 72 mL/min/1.73 (ref >59)

## 2023-12-17 LAB — CBC WITH DIFFERENTIAL/PLATELET
Basophils Absolute: 0.1 x10E3/uL (ref 0.0–0.2)
Basos: 1 %
EOS (ABSOLUTE): 0.5 x10E3/uL — ABNORMAL HIGH (ref 0.0–0.4)
Eos: 7 %
Hematocrit: 41.5 % (ref 37.5–51.0)
Hemoglobin: 13.5 g/dL (ref 13.0–17.7)
Immature Grans (Abs): 0 x10E3/uL (ref 0.0–0.1)
Immature Granulocytes: 0 %
Lymphocytes Absolute: 1.9 x10E3/uL (ref 0.7–3.1)
Lymphs: 27 %
MCH: 30.3 pg (ref 26.6–33.0)
MCHC: 32.5 g/dL (ref 31.5–35.7)
MCV: 93 fL (ref 79–97)
Monocytes Absolute: 0.8 x10E3/uL (ref 0.1–0.9)
Monocytes: 12 %
Neutrophils Absolute: 3.7 x10E3/uL (ref 1.4–7.0)
Neutrophils: 53 %
Platelets: 364 x10E3/uL (ref 150–450)
RBC: 4.45 x10E6/uL (ref 4.14–5.80)
RDW: 13.1 % (ref 11.6–15.4)
WBC: 7 x10E3/uL (ref 3.4–10.8)

## 2023-12-17 LAB — B12 AND FOLATE PANEL
Folate: 9.3 ng/mL (ref >3.0)
Vitamin B-12: 418 pg/mL (ref 232–1245)

## 2023-12-17 LAB — HEMOGLOBIN A1C
Est. average glucose Bld gHb Est-mCnc: 117 mg/dL
Hgb A1c MFr Bld: 5.7 % — ABNORMAL HIGH (ref 4.8–5.6)

## 2023-12-19 ENCOUNTER — Ambulatory Visit: Payer: Self-pay | Admitting: Nurse Practitioner

## 2023-12-19 MED ORDER — GABAPENTIN 100 MG PO CAPS: 100.0000 mg | ORAL_CAPSULE | Freq: Every day | ORAL | 0 refills | Status: AC

## 2023-12-19 NOTE — Progress Notes (Signed)
 A1c decreased to 5.7 compared to the previous 6.2. Blood cells, electrolytes, liver and kidney functions normal. Folate and vitamin B12 normal.  How is burning sensation on the abdomen now?  As discussed during the office visit if patient is agreeable I can send gabapentin.

## 2023-12-19 NOTE — Progress Notes (Signed)
 Gabapentin sent to the pharmacy. Advised pt to take it during bed time.

## 2023-12-21 DIAGNOSIS — R1031 Right lower quadrant pain: Secondary | ICD-10-CM | POA: Insufficient documentation

## 2023-12-21 NOTE — Assessment & Plan Note (Addendum)
 Patient reports intermittent electrical shock sensation in the right groin area.  Denies any difficulty walking. - Offered referral to orthopedic specialist patient politely deferred.

## 2023-12-21 NOTE — Assessment & Plan Note (Addendum)
 Advised patient to continue Prilosec half an hour before breakfast.

## 2024-01-16 ENCOUNTER — Other Ambulatory Visit: Payer: Self-pay | Admitting: Nurse Practitioner
